# Patient Record
Sex: Female | Born: 1975 | Marital: Married | Smoking: Never smoker
Health system: Southern US, Community
[De-identification: ages and names within clinical notes are randomized; demographics above are authoritative.]

## PROBLEM LIST (undated history)

## (undated) DIAGNOSIS — Z9882 Breast implant status: Secondary | ICD-10-CM

## (undated) DIAGNOSIS — C801 Malignant (primary) neoplasm, unspecified: Secondary | ICD-10-CM

## (undated) DIAGNOSIS — D249 Benign neoplasm of unspecified breast: Secondary | ICD-10-CM

## (undated) DIAGNOSIS — C50919 Malignant neoplasm of unspecified site of unspecified female breast: Secondary | ICD-10-CM

## (undated) DIAGNOSIS — IMO0002 Reserved for concepts with insufficient information to code with codable children: Secondary | ICD-10-CM

## (undated) DIAGNOSIS — Z9889 Other specified postprocedural states: Secondary | ICD-10-CM

## (undated) HISTORY — DX: Other specified postprocedural states: Z98.890

## (undated) HISTORY — DX: Reserved for concepts with insufficient information to code with codable children: IMO0002

## (undated) HISTORY — DX: Malignant neoplasm of unspecified site of unspecified female breast: C50.919

## (undated) HISTORY — DX: Breast implant status: Z98.82

## (undated) HISTORY — DX: Benign neoplasm of unspecified breast: D24.9

## (undated) HISTORY — PX: MASTECTOMY: SHX3

---

## 2001-05-26 DIAGNOSIS — D249 Benign neoplasm of unspecified breast: Secondary | ICD-10-CM

## 2001-05-26 HISTORY — PX: BREAST EXCISIONAL BIOPSY: SUR124

## 2001-05-26 HISTORY — DX: Benign neoplasm of unspecified breast: D24.9

## 2011-09-24 ENCOUNTER — Encounter: Payer: Self-pay | Admitting: Physician Assistant

## 2011-09-24 ENCOUNTER — Ambulatory Visit
Admission: RE | Admit: 2011-09-24 | Discharge: 2011-09-24 | Disposition: A | Discharge status: Home / Self Care | Payer: Managed Care, Other (non HMO) | Source: Ambulatory Visit | Attending: Physician Assistant | Admitting: Physician Assistant

## 2011-09-24 ENCOUNTER — Ambulatory Visit (INDEPENDENT_AMBULATORY_CARE_PROVIDER_SITE_OTHER): Payer: Managed Care, Other (non HMO) | Admitting: Physician Assistant

## 2011-09-24 VITALS — BP 129/74 | HR 72 | Temp 98.4°F | Ht 69.0 in | Wt 165.0 lb

## 2011-09-24 DIAGNOSIS — R0789 Other chest pain: Secondary | ICD-10-CM

## 2011-09-24 DIAGNOSIS — Z7689 Persons encountering health services in other specified circumstances: Secondary | ICD-10-CM

## 2011-09-24 DIAGNOSIS — R071 Chest pain on breathing: Secondary | ICD-10-CM

## 2011-09-24 DIAGNOSIS — Z0189 Encounter for other specified special examinations: Secondary | ICD-10-CM

## 2011-09-24 NOTE — Progress Notes (Signed)
  Subjective:    Patient ID: Katrina Deleon, female    DOB: 1976-01-09, 36 y.o.   MRN: [id number]  HPI Patient presents to the clinic to establish care. Past medical history and health maintenance were reviewed. Patient also wants to discuss right-sided chest wall tenderness and discomfort. Patient did have an upper respiratory infection 3 weeks ago with a sore throat and constant cough. She was treated only with over-the-counter eye is and has felt better for the last week except for her lingering cough. Her cough is better and she does not have any productivity. Her chest discomfort and pain started about a week and half ago. The pain is worse when she coughs. She has had no shortness of breath or wheezing. She denies any arm or jaw pain. She does not have any worsening of pain with exertion. She describes the pain as dull and like a pulled muscle on the inside. She does work out she has not worked out recently due to feeling so bad. She has not tried anything to make the pain better. She does not have a history of allergies but does complain of eye watering and itching this and a constant drainage of her throat. She also denies any symptoms of acid reflux.    Review of Systems     Objective:   Physical Exam  Constitutional: She is oriented to person, place, and time. She appears well-developed and well-nourished.  HENT:  Head: Normocephalic and atraumatic.  Right Ear: External ear normal.  Left Ear: External ear normal.  Nose: Nose normal.       Bilateral TMs were clear and ossicles are well visualized; however, there were multiple air bubbles throughout both TMs. Postnasal drip is present oropharynx.  Eyes: Conjunctivae are normal.  Neck: Normal range of motion. Neck supple.  Cardiovascular: Normal rate, regular rhythm and normal heart sounds.   Pulmonary/Chest: Effort normal and breath sounds normal. She has no wheezes.       No chest wall tenderness to palpation.  Lymphadenopathy:    She  has no cervical adenopathy.  Neurological: She is alert and oriented to person, place, and time.  Skin: Skin is warm and dry.  Psychiatric: She has a normal mood and affect. Her behavior is normal.          Assessment & Plan:  Right sided chest wall pain- I suspect this is costochondritis due to coughing and recent URI. We will get a chest x-ray today to rule out any pulmonary causes. Pregnancy test was conducted in office and was negative due to not being on birth control before the x-ray. I encouraged patient to take Delsym over-the-counter for any lingering cough. I suspect that she might have some postnasal drip to 2 allergies I recommended her to start Zyrtec daily. For the chest wall pain she can take ibuprofen 400 mg up to 3 times a day. I reassured her that costochondritis can last for up to 6 weeks. I gave handout on costochondritis. She was told that if chest tightness got worse or if there any new symptoms to call office and we could work up her heart for any causes.

## 2011-09-24 NOTE — Patient Instructions (Signed)
Delsym over the counter tends to be the best thing cough. I would try Zyrtec daily for post nasal drip and allergic symptoms. Take ibuprofen 400mg  up to three times a day for chest discomfort.  Will get chest x-ray and call with results.   Costochondritis Costochondritis (Tietze syndrome), or costochondral separation, is a swelling and irritation (inflammation) of the tissue (cartilage) that connects your ribs with your breastbone (sternum). It may occur on its own (spontaneously), through damage caused by an accident (trauma), or simply from coughing or minor exercise. It may take up to 6 weeks to get better and longer if you are unable to be conservative in your activities. HOME CARE INSTRUCTIONS   Avoid exhausting physical activity. Try not to strain your ribs during normal activity. This would include any activities using chest, belly (abdominal), and side muscles, especially if heavy weights are used.   Use ice for 15 to 20 minutes per hour while awake for the first 2 days. Place the ice in a plastic bag, and place a towel between the bag of ice and your skin.   Only take over-the-counter or prescription medicines for pain, discomfort, or fever as directed by your caregiver.  SEEK IMMEDIATE MEDICAL CARE IF:   Your pain increases or you are very uncomfortable.   You have a fever.   You develop difficulty with your breathing.   You cough up blood.   You develop worse chest pains, shortness of breath, sweating, or vomiting.   You develop new, unexplained problems (symptoms).  MAKE SURE YOU:   Understand these instructions.   Will watch your condition.   Will get help right away if you are not doing well or get worse.  Document Released: 02/19/2005 Document Revised: 05/01/2011 Document Reviewed: 12/29/2007 [facility] Patient Information 2012 [place], [place].

## 2012-06-09 ENCOUNTER — Encounter: Payer: Self-pay | Admitting: Physician Assistant

## 2012-06-09 ENCOUNTER — Ambulatory Visit (INDEPENDENT_AMBULATORY_CARE_PROVIDER_SITE_OTHER): Payer: Managed Care, Other (non HMO) | Admitting: Physician Assistant

## 2012-06-09 VITALS — BP 119/73 | HR 61 | Wt 165.0 lb

## 2012-06-09 DIAGNOSIS — Z13220 Encounter for screening for lipoid disorders: Secondary | ICD-10-CM

## 2012-06-09 DIAGNOSIS — Z0000 Encounter for general adult medical examination without abnormal findings: Secondary | ICD-10-CM

## 2012-06-09 DIAGNOSIS — Z131 Encounter for screening for diabetes mellitus: Secondary | ICD-10-CM

## 2012-06-09 LAB — COMPREHENSIVE METABOLIC PANEL
ALT: 15 U/L (ref 0–35)
AST: 15 U/L (ref 0–37)
Alkaline Phosphatase: 47 U/L (ref 39–117)
CO2: 26 mEq/L (ref 19–32)
Creat: 0.98 mg/dL (ref 0.50–1.10)
Sodium: 137 mEq/L (ref 135–145)
Total Bilirubin: 1 mg/dL (ref 0.3–1.2)
Total Protein: 7.4 g/dL (ref 6.0–8.3)

## 2012-06-09 LAB — LIPID PANEL
HDL: 59 mg/dL (ref >39)
LDL Cholesterol: 89 mg/dL (ref 0–99)
Total CHOL/HDL Ratio: 2.7 Ratio
Triglycerides: 51 mg/dL (ref <150)
VLDL: 10 mg/dL (ref 0–40)

## 2012-06-09 NOTE — Patient Instructions (Addendum)
Check on Tdap. Can make nurse visit if need one.   Will call with lab results.   Continue calcium add vitamin d 800 IU daily. Continue all the exercising!! GOOD work.   Follow up in 1 year.

## 2012-06-09 NOTE — Progress Notes (Signed)
  Subjective:     Katrina Deleon is a 37 y.o. female and is here for a comprehensive physical exam. The patient reports no problems.  History   Social History  . Marital Status: Married    Spouse Name: N/A    Number of Children: N/A  . Years of Education: N/A   Occupational History  . Not on file.   Social History Main Topics  . Smoking status: Never Smoker   . Smokeless tobacco: Not on file  . Alcohol Use: 0.5 oz/week    1 drink(s) per week  . Drug Use: No  . Sexually Active: Yes -- Female partner(s)    Birth Control/ Protection: None   Other Topics Concern  . Not on file   Social History Narrative  . No narrative on file   Health Maintenance  Topic Date Due  . Tetanus/tdap  05/07/1996  . Pap Smear  06/09/2014  . Influenza Vaccine  01/25/2012    The following portions of the patient's history were reviewed and updated as appropriate: allergies, current medications, past family history, past medical history, past social history, past surgical history and problem list.  Review of Systems A comprehensive review of systems was negative.   Objective:    BP 119/73  Pulse 61  Wt 165 lb (74.844 kg)  LMP 05/30/2012 General appearance: alert, cooperative and appears stated age Head: Normocephalic, without obvious abnormality, atraumatic Eyes: conjunctivae/corneas clear. PERRL, EOM's intact. Fundi benign. Ears: normal TM's and external ear canals both ears Nose: Nares normal. Septum midline. Mucosa normal. No drainage or sinus tenderness. Throat: lips, mucosa, and tongue normal; teeth and gums normal Neck: no adenopathy, no carotid bruit, no JVD, supple, symmetrical, trachea midline and thyroid not enlarged, symmetric, no tenderness/mass/nodules Back: symmetric, no curvature. ROM normal. No CVA tenderness. Lungs: clear to auscultation bilaterally Heart: regular rate and rhythm, S1, S2 normal, no murmur, click, rub or gallop Abdomen: soft, non-tender; bowel sounds  normal; no masses,  no organomegaly Extremities: extremities normal, atraumatic, no cyanosis or edema Pulses: 2+ and symmetric Skin: Skin color, texture, turgor normal. No rashes or lesions Lymph nodes: Cervical, supraclavicular, and axillary nodes normal. Neurologic: Grossly normal    Assessment:    Healthy female exam.      Plan:    CPE- Tdap is needed. Pt wants to go through records and see if she has had. Declines flu shot. Scheduled for pap and mammogram(early due to family hx of breast cancer) at [facility] on January 29th. Ordered fasting labs. Pt already taking calcium told her to continue but to add Vit D 800IU for better absorption. Continue exercising regularly with strength training. Follow up in 1 year.  See After Visit Summary for Counseling Recommendations

## 2012-06-23 ENCOUNTER — Encounter: Payer: Managed Care, Other (non HMO) | Admitting: Obstetrics & Gynecology

## 2012-06-29 ENCOUNTER — Ambulatory Visit (INDEPENDENT_AMBULATORY_CARE_PROVIDER_SITE_OTHER): Payer: Managed Care, Other (non HMO) | Admitting: Obstetrics & Gynecology

## 2012-06-29 ENCOUNTER — Encounter: Payer: Self-pay | Admitting: Obstetrics & Gynecology

## 2012-06-29 VITALS — BP 130/79 | HR 69 | Temp 98.6°F | Resp 16 | Ht 69.0 in | Wt 164.0 lb

## 2012-06-29 DIAGNOSIS — N979 Female infertility, unspecified: Secondary | ICD-10-CM

## 2012-06-29 DIAGNOSIS — Z01419 Encounter for gynecological examination (general) (routine) without abnormal findings: Secondary | ICD-10-CM

## 2012-06-29 DIAGNOSIS — Z803 Family history of malignant neoplasm of breast: Secondary | ICD-10-CM

## 2012-06-29 NOTE — Progress Notes (Signed)
  Subjective:     Katrina Deleon is a 37 y.o. female here for a routine exam.  Current complaints: infertility for > 58yr.  Personal health questionnaire reviewed: yes.   Gynecologic History Patient's last menstrual period was 06/18/2012. Contraception: none Last Pap: 2012. Results were: normal per pt Last mammogram: approx 2011. Results were: normal per pt  Obstetric History OB History    Grav Para Term Preterm Abortions TAB SAB Ect Mult Living   0 0 0 0 0 0 0 0 0 0        The following portions of the patient's history were reviewed and updated as appropriate: allergies, current medications, past family history, past medical history, past social history, past surgical history and problem list.  Review of Systems A comprehensive review of systems was negative.    Objective:   Filed Vitals:   06/29/12 1528  BP: 130/79  Pulse: 69  Temp: 98.6 F (37 C)  TempSrc: Oral  Resp: 16  Height: 5\' 9"  (1.753 m)  Weight: 164 lb (74.39 kg)     Vitals:  WNL General appearance: alert, cooperative and no distress Head: Normocephalic, without obvious abnormality, atraumatic Eyes: negative Throat: lips, mucosa, and tongue normal; teeth and gums normal Lungs: clear to auscultation bilaterally Breasts: normal appearance, no masses or tenderness, No nipple retraction or dimpling, No nipple discharge or bleeding Heart: regular rate and rhythm Abdomen: soft, non-tender; bowel sounds normal; no masses,  no organomegaly Pelvic: cervix normal in appearance, external genitalia normal, no adnexal masses or tenderness, no bladder tenderness, no cervical motion tenderness, perianal skin: no external genital warts noted, urethra without abnormality or discharge, uterus normal size, shape, and consistency and vagina normal without discharge Extremities: no edema, redness or tenderness in the calves or thighs Skin: no lesions or rash Lymph nodes: Axillary adenopathy: none       Assessment:    Healthy female exam.  Infertiltiy Mother--history of breast cancer   Plan:    Education reviewed: skin cancer screening and folic acid supplementation. Contraception: none. Mammogram ordered. Follow up in: 1 month. Discussed BRCA testing (mother does not have insurance) Folic acid supplementation Gave info on HSG and semen analysis Pap in 3 yrs if co testing is nml

## 2012-06-29 NOTE — Patient Instructions (Signed)
Semen Analysis This is a test used to learn about the health of your reproductive organs, particularly if your partner is having trouble becoming pregnant, or after a vasectomy to determine if the operation was successful. Semen is the turbid, whitish substance that is released from the penis during ejaculation. Sperm are the cells in semen with a head and a tail that enables them to travel to the egg. A sperm contains one copy of each chromosome (all of the female's genes) and fuses with the female's egg, resulting in fertilization.  PREPARATION FOR TEST A semen sample will be collected in a sterile, wide-mouth container provided by the lab. NORMAL FINDINGS  Volume: 2-5 mL  Liquefaction time: 20 to 30 minutes after collection  pH: 7.12-8.00  Sperm count (density): 50-200 million/mL  Sperm motility: 60% to 80% actively motile  Sperm morphology: 70% to 90% normally shaped Ranges for normal findings may vary among different laboratories and hospitals. You should always check with your doctor after having lab work or other tests done to discuss the meaning of your test results and whether your values are considered within normal limits. MEANING OF TEST  Your caregiver will go over the test results with you and discuss the importance and meaning of your results, as well as treatment options and the need for additional tests if necessary. OBTAINING THE TEST RESULTS It is your responsibility to obtain your test results. Ask the lab or department performing the test when and how you will get your results. Document Released: 06/06/2004 Document Revised: 08/04/2011 Document Reviewed: 04/23/2008 [facility] Patient Information 2013 [place], [place]. Hysterosalpingography Hysterosalpingography is a procedure using an X-ray and contrast dye to look at the inside of your uterus and fallopian tubes. The contrast dye is injected into the uterus through the vagina and cervix while X-ray pictures are taken. This  procedure may help your caregiver determine whether you have uterine tumors, adhesions, or structural abnormalities. It is commonly used to help determine reasons why a woman is unable to have children (infertile). LET YOUR CAREGIVER KNOW ABOUT:  Allergies to medicine or food, especially shellfish.  Medicines taken, including vitamins, herbs, eyedrops, over-the-counter medicines, and creams.  Use of steroids (by mouth or creams).  Previous problems with anesthetics or numbing medicines.  History of bleeding problems or blood clots.  Previous pelvic surgery.  Other health problems, including diabetes and kidney problems.  Possibility of pregnancy.  Any allergies to iodine or X-ray dye (contrast dye).  Any recent pelvic infections or sexually transmitted diseases (STDs). RISKS AND COMPLICATIONS   Infection in the lining of the uterus (endometritis) or fallopian tubes (salpingitis).  Damage or puncturing of the uterus or fallopian tubes.  An allergic reaction to the contrast dye used to perform the X-ray. BEFORE THE PROCEDURE   Schedule the procedure after your period stops, but before your next ovulation. This is usually between day 5 and 10 of your last period. Day 1 is the first day of your period.  Ask your caregiver about changing or stopping your regular medicines.  You may eat and drink as normal.  You may be given a medicine to relax you (sedative) or an over-the-counter pain medicine to lessen any discomfort during the procedure.  Empty your bladder before the procedure begins. PROCEDURE  You will lie down on an X-ray table with your feet in stirrups.  A device called a speculum will be placed into your vagina. This allows your caregiver to see inside your vagina to the cervix.  The cervix will be washed with a special soap.  A thin, flexible tube will be passed through the cervix into the uterus.  Contrast dye will be put into this tube.  Several X-rays will  be taken as the contrast dye spreads through the uterus and fallopian tubes.  The tube will be taken out after the procedure.  The procedure usually lasts about 15 to 30 minutes. AFTER THE PROCEDURE   Most of the contrast dye will flow out naturally. You may want to wear a sanitary pad.  You may have cramping and spotting. This should go away in 24 hours.  Ask when your test results will be ready. Make sure you get your test results. Document Released: 06/14/2004 Document Revised: 08/04/2011 Document Reviewed: 04/01/2011 [facility] Patient Information 2013 [place], [place].

## 2012-09-21 ENCOUNTER — Ambulatory Visit (INDEPENDENT_AMBULATORY_CARE_PROVIDER_SITE_OTHER): Payer: Managed Care, Other (non HMO) | Admitting: Family Medicine

## 2012-09-21 ENCOUNTER — Encounter: Payer: Self-pay | Admitting: Family Medicine

## 2012-09-21 VITALS — BP 111/67 | HR 84 | Temp 98.7°F | Wt 169.0 lb

## 2012-09-21 DIAGNOSIS — B9689 Other specified bacterial agents as the cause of diseases classified elsewhere: Secondary | ICD-10-CM

## 2012-09-21 DIAGNOSIS — J329 Chronic sinusitis, unspecified: Secondary | ICD-10-CM

## 2012-09-21 DIAGNOSIS — A499 Bacterial infection, unspecified: Secondary | ICD-10-CM

## 2012-09-21 MED ORDER — HYDROCODONE-HOMATROPINE 5-1.5 MG/5ML PO SYRP: 5.0000 mL | ORAL_SOLUTION | Freq: Every evening | ORAL | Status: DC | PRN

## 2012-09-21 MED ORDER — PREDNISONE 20 MG PO TABS: ORAL_TABLET | ORAL | Status: DC

## 2012-09-21 MED ORDER — DOXYCYCLINE HYCLATE 100 MG PO TABS: ORAL_TABLET | ORAL | Status: DC

## 2012-09-21 NOTE — Progress Notes (Signed)
CC: Katrina Deleon is a 37 y.o. female is here for Sinusitis   Subjective: HPI:  Patient presents for concerns of nasal congestion. This is been present off and on ever since November of 2013 after cleaning out her attic.  Current symptoms include moderate nasal congestion of thick yellow discharge with a heaviness sensation within the nose. This discomfort is directly related to a nonproductive cough and irritation in the back of her throat. Symptoms seem to be worse when leaning forward. Current episode of symptoms have been lasting for about 2 weeks and remain a moderate severity all hours of the day.  Identical symptoms were eradicated with high-dose amoxicillin months ago but symptoms returned within 2 weeks. This was followed by Augmentin that did not help despite 10 days therapy. She has also tried an unknown nasal steroid without benefit.  Recently she complains of subjective fevers and a MAXIMUM TEMPERATURE of 100.0 oral. She denies confusion, headaches, motor sensory disturbances, shortness of breath, wheezing, chest pain, productive cough, orthopnea.   Review Of Systems Outlined In HPI  Past Medical History  Diagnosis Date  . Fibroadenoma of breast 2003  . Ulcer      Family History  Problem Relation Age of Onset  . Breast cancer Mother   . Breast cancer Paternal Grandmother   . Uterine cancer Maternal Grandmother      History  Substance Use Topics  . Smoking status: Never Smoker   . Smokeless tobacco: Never Used  . Alcohol Use: 0.5 oz/week    1 drink(s) per week     Objective: Filed Vitals:   09/21/12 0929  BP: 111/67  Pulse: 84  Temp: 98.7 F (37.1 C)    General: Alert and Oriented, No Acute Distress HEENT: Pupils equal, round, reactive to light. Conjunctivae clear.  External ears unremarkable, canals clear with intact TMs with appropriate landmarks.  Serous effusion of moderate severity bilaterally in the middle ear. Erythematous inferior turbinates with  moderate mucoid discharge.  Moist mucous membranes, Neck supple without palpable lymphadenopathy nor abnormal masses. Moderate posterior pharynx cobblestoning Lungs: Clear to auscultation bilaterally, no wheezing/ronchi/rales.  Comfortable work of breathing. Good air movement. Cardiac: Regular rate and rhythm. Normal S1/S2.  No murmurs, rubs, nor gallops.   Extremities: No peripheral edema.  Strong peripheral pulses.  Mental Status: No depression, anxiety, nor agitation. Skin: Warm and dry.  Assessment & Plan: [name] was seen today for sinusitis.  Diagnoses and associated orders for this visit:  Bacterial sinusitis - doxycycline (VIBRA-TABS) 100 MG tablet; One by mouth twice a day for ten days. - predniSONE (DELTASONE) 20 MG tablet; Three tabs daily days 1-3, two tabs daily days 4-6, one tab daily days 7-9, half tab daily days 10-13. - HYDROcodone-homatropine (HYCODAN) 5-1.5 MG/5ML syrup; Take 5 mLs by mouth at bedtime as needed for cough.    Bacterial sinusitis: Start prednisone taper and doxycycline and may use Hycodan to help with sleep but discussed I expect her cough is significantly improved as sinus and postnasal drip issues resolve. I've asked her to call me for lack of improvement or return of symptoms to then have CT scan of the sinuses Return if symptoms worsen or fail to improve.

## 2012-09-27 ENCOUNTER — Encounter: Payer: Self-pay | Admitting: Family Medicine

## 2012-09-27 ENCOUNTER — Ambulatory Visit (INDEPENDENT_AMBULATORY_CARE_PROVIDER_SITE_OTHER): Payer: Managed Care, Other (non HMO) | Admitting: Family Medicine

## 2012-09-27 VITALS — BP 113/74 | HR 64 | Temp 97.9°F | Wt 165.0 lb

## 2012-09-27 DIAGNOSIS — J309 Allergic rhinitis, unspecified: Secondary | ICD-10-CM

## 2012-09-27 MED ORDER — BECLOMETHASONE DIPROPIONATE 80 MCG/ACT NA AERS: 2.0000 | INHALATION_SPRAY | Freq: Every day | NASAL | Status: DC

## 2012-09-27 NOTE — Progress Notes (Signed)
CC: Katrina Deleon is a 37 y.o. female is here for Sinusitis   Subjective: HPI:  Complains of nasal congestion worsened since stopping prednisone Friday (nausea, fatigue, leg pain, trouble concentrating) and doxycycline Saturday (nausea).  Clear and without facial pain.  Moderate-severe congestion sensation in nose with inability to breath through nose.  Improves greatly with overseas Rx that sounds to be similar to afrin, has taken nightly since Saturday.  Congestion worse than when seen last week, has been at least mild-moderate severity since November attic cleaning.  Admits last nasal steroid only used for a few days in the past.  Denies fevers, chills, nausea, headache, confusion, chest congestion, chest pain, SOB since stopping prednisone and ABX.      Review Of Systems Outlined In HPI  Past Medical History  Diagnosis Date  . Fibroadenoma of breast 2003  . Ulcer      Family History  Problem Relation Age of Onset  . Breast cancer Mother   . Breast cancer Paternal Grandmother   . Uterine cancer Maternal Grandmother      History  Substance Use Topics  . Smoking status: Never Smoker   . Smokeless tobacco: Never Used  . Alcohol Use: 0.5 oz/week    1 drink(s) per week     Objective: Filed Vitals:   09/27/12 1136  BP: 113/74  Pulse: 64  Temp: 97.9 F (36.6 C)    General: Alert and Oriented, No Acute Distress HEENT: Pupils equal, round, reactive to light. Conjunctivae clear.  Moist mucous membranes Lungs: clear and comfortable work of breathing Extremities: No peripheral edema.  Strong peripheral pulses.  Mental Status: No depression, anxiety, nor agitation. Skin: Warm and dry.  Assessment & Plan: Sapphire was seen today for sinusitis.  Diagnoses and associated orders for this visit:  Allergic rhinitis - Beclomethasone Dipropionate 80 MCG/ACT AERS; Place 2 sprays into the nose daily.    Patient encounter cut short due to evacuation of clinc due to phenol  spill.  Will not charge her due to incomplete exam and due to inconvienence.  Start Qnasl and update me later in the week, may take up to 2 weeks for full effect, expect some improvement by Friday-Monday  Return if symptoms worsen or fail to improve.

## 2014-07-05 ENCOUNTER — Emergency Department (HOSPITAL_BASED_OUTPATIENT_CLINIC_OR_DEPARTMENT_OTHER)
Admission: EM | Admit: 2014-07-05 | Discharge: 2014-07-06 | Disposition: A | Discharge status: Home / Self Care | Payer: Managed Care, Other (non HMO) | Attending: Emergency Medicine | Admitting: Emergency Medicine

## 2014-07-05 ENCOUNTER — Emergency Department (HOSPITAL_BASED_OUTPATIENT_CLINIC_OR_DEPARTMENT_OTHER): Payer: Managed Care, Other (non HMO)

## 2014-07-05 ENCOUNTER — Encounter (HOSPITAL_BASED_OUTPATIENT_CLINIC_OR_DEPARTMENT_OTHER): Payer: Self-pay

## 2014-07-05 ENCOUNTER — Ambulatory Visit (INDEPENDENT_AMBULATORY_CARE_PROVIDER_SITE_OTHER): Payer: Managed Care, Other (non HMO) | Admitting: Family Medicine

## 2014-07-05 ENCOUNTER — Encounter: Payer: Self-pay | Admitting: Family Medicine

## 2014-07-05 DIAGNOSIS — R197 Diarrhea, unspecified: Secondary | ICD-10-CM | POA: Insufficient documentation

## 2014-07-05 DIAGNOSIS — R110 Nausea: Secondary | ICD-10-CM | POA: Diagnosis not present

## 2014-07-05 DIAGNOSIS — R1031 Right lower quadrant pain: Secondary | ICD-10-CM | POA: Insufficient documentation

## 2014-07-05 DIAGNOSIS — Z7951 Long term (current) use of inhaled steroids: Secondary | ICD-10-CM | POA: Diagnosis not present

## 2014-07-05 DIAGNOSIS — R1032 Left lower quadrant pain: Secondary | ICD-10-CM | POA: Diagnosis not present

## 2014-07-05 DIAGNOSIS — Z86018 Personal history of other benign neoplasm: Secondary | ICD-10-CM | POA: Insufficient documentation

## 2014-07-05 DIAGNOSIS — R1030 Lower abdominal pain, unspecified: Secondary | ICD-10-CM

## 2014-07-05 DIAGNOSIS — Z3202 Encounter for pregnancy test, result negative: Secondary | ICD-10-CM | POA: Insufficient documentation

## 2014-07-05 DIAGNOSIS — Z79899 Other long term (current) drug therapy: Secondary | ICD-10-CM | POA: Insufficient documentation

## 2014-07-05 DIAGNOSIS — R109 Unspecified abdominal pain: Secondary | ICD-10-CM

## 2014-07-05 HISTORY — DX: Malignant (primary) neoplasm, unspecified: C80.1

## 2014-07-05 LAB — COMPREHENSIVE METABOLIC PANEL
ALBUMIN: 4.5 g/dL (ref 3.5–5.2)
ALK PHOS: 41 U/L (ref 39–117)
ALT: 15 U/L (ref 0–35)
ANION GAP: 4 — AB (ref 5–15)
AST: 17 U/L (ref 0–37)
BILIRUBIN TOTAL: 1 mg/dL (ref 0.3–1.2)
BUN: 17 mg/dL (ref 6–23)
CHLORIDE: 105 mmol/L (ref 96–112)
CO2: 27 mmol/L (ref 19–32)
CREATININE: 0.93 mg/dL (ref 0.50–1.10)
Calcium: 8.8 mg/dL (ref 8.4–10.5)
GFR, EST AFRICAN AMERICAN: 90 mL/min — AB (ref >90)
GFR, EST NON AFRICAN AMERICAN: 78 mL/min — AB (ref >90)
GLUCOSE: 103 mg/dL — AB (ref 70–99)
POTASSIUM: 3.9 mmol/L (ref 3.5–5.1)
Sodium: 136 mmol/L (ref 135–145)
Total Protein: 8.2 g/dL (ref 6.0–8.3)

## 2014-07-05 LAB — URINALYSIS, ROUTINE W REFLEX MICROSCOPIC
Bilirubin Urine: NEGATIVE
Glucose, UA: NEGATIVE mg/dL
Hgb urine dipstick: NEGATIVE
Ketones, ur: 15 mg/dL — AB
Nitrite: NEGATIVE
PROTEIN: NEGATIVE mg/dL
Specific Gravity, Urine: 1.02 (ref 1.005–1.030)
Urobilinogen, UA: 0.2 mg/dL (ref 0.0–1.0)
pH: 5.5 (ref 5.0–8.0)

## 2014-07-05 LAB — POCT URINE PREGNANCY: Preg Test, Ur: NEGATIVE

## 2014-07-05 LAB — POCT URINALYSIS DIPSTICK
BILIRUBIN UA: NEGATIVE
Blood, UA: NEGATIVE
Glucose, UA: NEGATIVE
Ketones, UA: NEGATIVE
NITRITE UA: NEGATIVE
PH UA: 6
PROTEIN UA: NEGATIVE
Spec Grav, UA: 1.02
UROBILINOGEN UA: 0.2

## 2014-07-05 LAB — CBC
HEMATOCRIT: 41.7 % (ref 36.0–46.0)
Hemoglobin: 14 g/dL (ref 12.0–15.0)
MCH: 30.4 pg (ref 26.0–34.0)
MCHC: 33.6 g/dL (ref 30.0–36.0)
MCV: 90.5 fL (ref 78.0–100.0)
Platelets: 240 10*3/uL (ref 150–400)
RBC: 4.61 MIL/uL (ref 3.87–5.11)
RDW: 13.2 % (ref 11.5–15.5)
WBC: 16 10*3/uL — AB (ref 4.0–10.5)

## 2014-07-05 LAB — URINE MICROSCOPIC-ADD ON

## 2014-07-05 LAB — PREGNANCY, URINE: Preg Test, Ur: NEGATIVE

## 2014-07-05 MED ORDER — IOHEXOL 300 MG/ML  SOLN
100.0000 mL | Freq: Once | INTRAMUSCULAR | Status: AC | PRN
  Administered 2014-07-05: 100 mL via INTRAVENOUS

## 2014-07-05 MED ORDER — IOHEXOL 300 MG/ML  SOLN
25.0000 mL | Freq: Once | INTRAMUSCULAR | Status: AC | PRN
  Administered 2014-07-05: 25 mL via ORAL

## 2014-07-05 MED ORDER — ACETAMINOPHEN 325 MG PO TABS
650.0000 mg | ORAL_TABLET | Freq: Once | ORAL | Status: AC
  Administered 2014-07-05: 650 mg via ORAL
  Filled 2014-07-05: qty 2

## 2014-07-05 MED ORDER — SODIUM CHLORIDE 0.9 % IV BOLUS (SEPSIS)
1000.0000 mL | Freq: Once | INTRAVENOUS | Status: AC
Start: 2014-07-05 — End: 2014-07-05
  Administered 2014-07-05: 1000 mL via INTRAVENOUS

## 2014-07-05 MED ORDER — IOHEXOL 300 MG/ML  SOLN: 100.0000 mL | Freq: Once | INTRAMUSCULAR | Status: AC | PRN

## 2014-07-05 NOTE — Progress Notes (Signed)
CC: Katrina Deleon is a 39 y.o. female is here for Abdominal Pain   Subjective: HPI:  Complains of pain and bloating localized at the umbilicus and just below the umbilicus. It is constant and also throbbing. Nothing particularly makes it better or worse. It has been worsening since onset and last night was bothering her slightly before going to bed however became bad enough to where it woke her and kept her up for the majority of last night. She had 2-3 bowel movements last night after the pain escalated and this did not seem to change the character or severity of pain but was more loose that she is used to. There is no blood or melena appearance to the stool. She's had a decreased appetite and questionable nausea. Pain is radiating into the back as of today. She has no genitourinary complaints today other than typical vaginal secretions at this time of her cycle that may or may not be heavier than usual. She specifically denies any vaginal bleeding, dysuria, urinary urgency or frequency. Review of systems is positive for feeling cold and clammy today. No fevers or chills. She's never had this before and has no history of abdominal or pelvic surgery.   Review Of Systems Outlined In HPI  Past Medical History  Diagnosis Date  . Fibroadenoma of breast 2003  . Ulcer     Past Surgical History  Procedure Laterality Date  . Breast excisional biopsy  2003    benign   Family History  Problem Relation Age of Onset  . Breast cancer Mother   . Breast cancer Paternal Grandmother   . Uterine cancer Maternal Grandmother     History   Social History  . Marital Status: Married    Spouse Name: N/A  . Number of Children: N/A  . Years of Education: N/A   Occupational History  . accountant    Social History Main Topics  . Smoking status: Never Smoker   . Smokeless tobacco: Never Used  . Alcohol Use: 0.5 oz/week    1 drink(s) per week  . Drug Use: No  . Sexual Activity:    Partners: Male     Birth Control/ Protection: None   Other Topics Concern  . Not on file   Social History Narrative     Objective: BP 106/63 mmHg  Pulse 75  Ht 5\' 11"  (1.803 m)  Wt 147 lb (66.679 kg)  BMI 20.51 kg/m2  General: Alert and Oriented, No Acute Distress HEENT: Pupils equal, round, reactive to light. Conjunctivae clear.  Moist mucous membranes Lungs: clinical comfortable work of breathing Cardiac: Regular rate and rhythm.  Abdomen: Normal bowel sounds, soft without guarding. No right upper quadrant or left upper quadrant pain or palpable masses in these quadrants. No palpable masses elsewhere. She has both right and left lower quadrant tenderness and rebound tenderness. Psoas sign positive on the right. Extremities: No peripheral edema.  Strong peripheral pulses.  Mental Status: No depression, anxiety, nor agitation. Skin: Warm and dry.  Assessment & Plan: Katrina Deleon was seen today for abdominal pain.  Diagnoses and all orders for this visit:  Abdominal pain, unspecified abdominal location Orders: -     POCT Urinalysis Dipstick   Abdominal pain: Urinalysis significant only for leukocytes, low suspicion of UTI. Pregnancy test was negative. Discussed with her that my suspicion for appendicitis or diverticulitis is quite high. Unfortunately outpatient imaging offices will not be an option tonight given that it is 5:00 PM. I suspicion is high  enough to where I think she needs imaging tonight I have advised her to go to a local emergency room of her choice. She was originally going to go to the [facility] emergency room however when  This emergency room was called about her arrival they informed my assistant that they're imaging services are off-line. The patient was then advised to go to the [facility] in [facility] and my assistant called the emergency room even though her chief complaint is abdominal pain I'm concerned of appendicitis or diverticulitis.  Return if symptoms  worsen or fail to improve.

## 2014-07-05 NOTE — ED Provider Notes (Signed)
CSN: [id number]     Arrival date & time 07/05/14  1719 History   First MD Initiated Contact with Patient 07/05/14 1745     Chief Complaint  Patient presents with  . Abdominal Pain     (Consider location/radiation/quality/duration/timing/severity/associated sxs/prior Treatment) HPI 39 year old female presents with abdominal pain that started 3 days ago. His been gradually worsening. Last night her pain seems significantly worse and has been concerning her all day. The pain mostly is periumbilical and in her lower abdomen. Some nausea but no vomiting. Has not had any fevers or chills. Feels like her lower back hurts as well. Has started to have progressively looser stools throughout the day. Has not taken anything for the pain. Rates her pain as a 5 or 6 out of 10. Denies any urinary symptoms and has no vaginal bleeding or discharge. Never had pain like this before. Went to her PCP and they described her having rebound tenderness and center here for CT.  Past Medical History  Diagnosis Date  . Fibroadenoma of breast 2003  . Ulcer    Past Surgical History  Procedure Laterality Date  . Breast excisional biopsy  2003    benign   Family History  Problem Relation Age of Onset  . Breast cancer Mother   . Breast cancer Paternal Grandmother   . Uterine cancer Maternal Grandmother    History  Substance Use Topics  . Smoking status: Never Smoker   . Smokeless tobacco: Never Used  . Alcohol Use: 0.5 oz/week    1 drink(s) per week   OB History    Gravida Para Term Preterm AB TAB SAB Ectopic Multiple Living   0 0 0 0 0 0 0 0 0 0      Review of Systems  Constitutional: Negative for fever.  Gastrointestinal: Positive for nausea, abdominal pain and diarrhea. Negative for vomiting.  Genitourinary: Negative for dysuria, vaginal bleeding and vaginal discharge.  Musculoskeletal: Positive for back pain.  All other systems reviewed and are negative.     Allergies  Peanuts  Home  Medications   Prior to Admission medications   Medication Sig Start Date End Date Taking? Authorizing Provider  Beclomethasone Dipropionate 80 MCG/ACT AERS Place 2 sprays into the nose daily. 09/27/12   Marcial Pacas, DO  Calcium Carbonate (CALCIUM 600 PO) Take by mouth.    Historical Provider, MD  fish oil-omega-3 fatty acids 1000 MG capsule Take 2 g by mouth daily.    Historical Provider, MD   BP 136/71 mmHg  Pulse 74  Temp(Src) 98.4 F (36.9 C) (Oral)  Resp 18  Ht 5\' 11"  (1.803 m)  Wt 143 lb (64.864 kg)  BMI 19.95 kg/m2  SpO2 100%  LMP 06/05/2014 Physical Exam  Constitutional: She is oriented to person, place, and time. She appears well-developed and well-nourished.  HENT:  Head: Normocephalic and atraumatic.  Right Ear: External ear normal.  Left Ear: External ear normal.  Nose: Nose normal.  Eyes: Right eye exhibits no discharge. Left eye exhibits no discharge.  Cardiovascular: Normal rate, regular rhythm and normal heart sounds.   Pulmonary/Chest: Effort normal and breath sounds normal.  Abdominal: Soft. Normal appearance. She exhibits no distension. There is tenderness in the right lower quadrant and left lower quadrant. There is no CVA tenderness.  Neurological: She is alert and oriented to person, place, and time.  Skin: Skin is warm and dry.  Nursing note and vitals reviewed.   ED Course  Procedures (including critical care time) Labs Review  Labs Reviewed  CBC - Abnormal; Notable for the following:    WBC 16.0 (*)    All other components within normal limits  COMPREHENSIVE METABOLIC PANEL - Abnormal; Notable for the following:    Glucose, Bld 103 (*)    GFR calc non Af Amer 78 (*)    GFR calc Af Amer 90 (*)    Anion gap 4 (*)    All other components within normal limits  URINALYSIS, ROUTINE W REFLEX MICROSCOPIC - Abnormal; Notable for the following:    Ketones, ur 15 (*)    Leukocytes, UA SMALL (*)    All other components within normal limits  URINE  MICROSCOPIC-ADD ON - Abnormal; Notable for the following:    Squamous Epithelial / LPF FEW (*)    All other components within normal limits  PREGNANCY, URINE    Imaging Review US Transvaginal Non-ob  07/06/2014   CLINICAL DATA:  Right pelvic pain for 4 days, increasing in intensity. CT earlier today demonstrated inflammatory process in the right lower quadrant of nonspecific etiology.  EXAM: TRANSABDOMINAL AND TRANSVAGINAL ULTRASOUND OF PELVIS  DOPPLER ULTRASOUND OF OVARIES  TECHNIQUE: Both transabdominal and transvaginal ultrasound examinations of the pelvis were performed. Transabdominal technique was performed for global imaging of the pelvis including uterus, ovaries, adnexal regions, and pelvic cul-de-sac.  It was necessary to proceed with endovaginal exam following the transabdominal exam to visualize the uterus and ovaries. Color and duplex Doppler ultrasound was utilized to evaluate blood flow to the ovaries.  COMPARISON:  CT 07/05/2014  FINDINGS: Uterus  Measurements: 8.2 x 4.2 x 5.2 cm. No fibroids or other mass visualized.  Endometrium  Thickness: 7 mm.  No focal abnormality visualized.  Right ovary  Measurements: 3.8 x 2 x 2.6 cm. Normal appearance/no adnexal mass. There is fluid around the right ovary but no loculated fluid collection is demonstrated. No evidence of hydrosalpinx.  Left ovary  Measurements: 3 x 1.7 x 2.3 cm. Normal appearance/no adnexal mass.  Pulsed Doppler evaluation of both ovaries demonstrates normal low-resistance arterial and venous waveforms. Flow is demonstrated in both ovaries on color flow Doppler imaging.  Other findings  Moderate amount of free fluid in the pelvis.  IMPRESSION: Normal ultrasound appearance of the uterus and ovaries. Moderate amount of free fluid in the pelvis is likely reactive, given inflammatory process seen in the right lower quadrant on CT. No evidence of ovarian mass, abscess, or torsion.  These results were called by telephone at the time of  interpretation on 07/06/2014 at 12:02 am to Dr. Sherwood Gambler , who verbally acknowledged these results.   Electronically Signed   By: Lucienne Capers M.D.   On: 07/06/2014 00:03   US Pelvis Complete  07/06/2014   CLINICAL DATA:  Right pelvic pain for 4 days, increasing in intensity. CT earlier today demonstrated inflammatory process in the right lower quadrant of nonspecific etiology.  EXAM: TRANSABDOMINAL AND TRANSVAGINAL ULTRASOUND OF PELVIS  DOPPLER ULTRASOUND OF OVARIES  TECHNIQUE: Both transabdominal and transvaginal ultrasound examinations of the pelvis were performed. Transabdominal technique was performed for global imaging of the pelvis including uterus, ovaries, adnexal regions, and pelvic cul-de-sac.  It was necessary to proceed with endovaginal exam following the transabdominal exam to visualize the uterus and ovaries. Color and duplex Doppler ultrasound was utilized to evaluate blood flow to the ovaries.  COMPARISON:  CT 07/05/2014  FINDINGS: Uterus  Measurements: 8.2 x 4.2 x 5.2 cm. No fibroids or other mass visualized.  Endometrium  Thickness: 7  mm.  No focal abnormality visualized.  Right ovary  Measurements: 3.8 x 2 x 2.6 cm. Normal appearance/no adnexal mass. There is fluid around the right ovary but no loculated fluid collection is demonstrated. No evidence of hydrosalpinx.  Left ovary  Measurements: 3 x 1.7 x 2.3 cm. Normal appearance/no adnexal mass.  Pulsed Doppler evaluation of both ovaries demonstrates normal low-resistance arterial and venous waveforms. Flow is demonstrated in both ovaries on color flow Doppler imaging.  Other findings  Moderate amount of free fluid in the pelvis.  IMPRESSION: Normal ultrasound appearance of the uterus and ovaries. Moderate amount of free fluid in the pelvis is likely reactive, given inflammatory process seen in the right lower quadrant on CT. No evidence of ovarian mass, abscess, or torsion.  These results were called by telephone at the time of  interpretation on 07/06/2014 at 12:02 am to Dr. Sherwood Gambler , who verbally acknowledged these results.   Electronically Signed   By: Lucienne Capers M.D.   On: 07/06/2014 00:03   Ct Abdomen Pelvis W Contrast  07/05/2014   CLINICAL DATA:  Periumbilical abdominal pain and bloating for 4 days.  EXAM: CT ABDOMEN AND PELVIS WITH CONTRAST  TECHNIQUE: Multidetector CT imaging of the abdomen and pelvis was performed using the standard protocol following bolus administration of intravenous contrast.  CONTRAST:  159mL OMNIPAQUE IOHEXOL 300 MG/ML SOLN, 65mL OMNIPAQUE IOHEXOL 300 MG/ML SOLN  COMPARISON:  None.  FINDINGS: Lower chest: The lung bases are clear of acute process. No pleural effusion or pulmonary lesions. The heart is normal in size. No pericardial effusion. The distal esophagus and aorta are unremarkable.  Hepatobiliary: Normal.  Pancreas: Normal.  Spleen: Normal.  Adrenals/Urinary Tract: Normal.  Stomach/Bowel: The stomach, duodenum, small bowel and colon are unremarkable. No inflammatory changes, mass lesions or obstructive findings. There is however a right lower quadrant/ right-sided pelvic process which is difficult to completely assess as the patient has very little intraperitoneal fat. Could not exclude appendicitis. This could also be a right ovarian process. There is right lower quadrant and free pelvic fluid.  Vascular/Lymphatic: No mesenteric or retroperitoneal mass or adenopathy. The aorta and branch vessels are normal. The major venous structures are patent.  Reproductive: The uterus appears normal. The left ovary is normal. The right ovary is difficult to distinguish for certain. The bladder is unremarkable.  Other: No abdominal wall hernia or subcutaneous lesions  Musculoskeletal: Normal  IMPRESSION: Right lower quadrant/ right-sided pelvic inflammatory process. Findings could be due to appendicitis or a right ovarian process such as tubo-ovarian abscess. Pelvic ultrasound may be helpful,  particularly if the right ovary can be identified for certain as separate from this process.  Small amount of right-sided pelvic and free pelvic fluid.   Electronically Signed   By: Marijo Sanes M.D.   On: 07/05/2014 21:05   [place] Art/ven Flow Abd Pelv Doppler  07/06/2014   CLINICAL DATA:  Right pelvic pain for 4 days, increasing in intensity. CT earlier today demonstrated inflammatory process in the right lower quadrant of nonspecific etiology.  EXAM: TRANSABDOMINAL AND TRANSVAGINAL ULTRASOUND OF PELVIS  DOPPLER ULTRASOUND OF OVARIES  TECHNIQUE: Both transabdominal and transvaginal ultrasound examinations of the pelvis were performed. Transabdominal technique was performed for global imaging of the pelvis including uterus, ovaries, adnexal regions, and pelvic cul-de-sac.  It was necessary to proceed with endovaginal exam following the transabdominal exam to visualize the uterus and ovaries. Color and duplex Doppler ultrasound was utilized to evaluate blood flow to the  ovaries.  COMPARISON:  CT 07/05/2014  FINDINGS: Uterus  Measurements: 8.2 x 4.2 x 5.2 cm. No fibroids or other mass visualized.  Endometrium  Thickness: 7 mm.  No focal abnormality visualized.  Right ovary  Measurements: 3.8 x 2 x 2.6 cm. Normal appearance/no adnexal mass. There is fluid around the right ovary but no loculated fluid collection is demonstrated. No evidence of hydrosalpinx.  Left ovary  Measurements: 3 x 1.7 x 2.3 cm. Normal appearance/no adnexal mass.  Pulsed Doppler evaluation of both ovaries demonstrates normal low-resistance arterial and venous waveforms. Flow is demonstrated in both ovaries on color flow Doppler imaging.  Other findings  Moderate amount of free fluid in the pelvis.  IMPRESSION: Normal ultrasound appearance of the uterus and ovaries. Moderate amount of free fluid in the pelvis is likely reactive, given inflammatory process seen in the right lower quadrant on CT. No evidence of ovarian mass, abscess, or torsion.   These results were called by telephone at the time of interpretation on 07/06/2014 at 12:02 am to Dr. Sherwood Gambler , who verbally acknowledged these results.   Electronically Signed   By: Lucienne Capers M.D.   On: 07/06/2014 00:03     EKG Interpretation None      MDM   Final diagnoses:  Lower abdominal pain    Given her lower abdominal pain, elevated WBC to 16 and fluid collection in RLQ and pelvis, concern is high for appendicitis. Ultrasound shows no ovarian pathology. Given this, I am concerned for her appendix. Her history and exam is atypical but still concerning. D/w surgery, Dr. Marcello Moores, who recommends medicine observation admission and surgery can be consulted in AM vs follow up in their clinic tomorrow (patient must call). I had an extensive conversation with patient about risks/benefits of staying vs leaving, recommending admission but patient declines, citing her feeling better and cost of admission. I discussed strict return precautions and welcomed her back to ER (here, [facility], [facility], etc) or surgery clinic. Patient and significant other verbalized understanding.    Ephraim Hamburger, MD 07/06/14 0040

## 2014-07-05 NOTE — Addendum Note (Signed)
Addended by: Terance Hart on: 07/05/2014 05:25 PM   Modules accepted: Orders

## 2014-07-05 NOTE — ED Notes (Signed)
Patient transported to Ultrasound 

## 2014-07-05 NOTE — ED Notes (Signed)
Reports lower abdominal pain since Sunday. Sts some nausea but no vomiting. Reports throbbing sensation. Sent by PMD today.

## 2014-07-06 ENCOUNTER — Telehealth: Payer: Self-pay | Admitting: Emergency Medicine

## 2014-07-06 NOTE — Telephone Encounter (Signed)
Patient calls to report on her ER visit yesterday. Her question for Dr.Hommel is: does he think she should go on preventative oral antibiotics since ER physician thought it might be appropriate for unspecified lower right abdominal inflammation/prevetative for possible appendicitis? ER entries available. Please call tomorrow/ 07-07-14.

## 2014-07-06 NOTE — Discharge Instructions (Signed)
I am concerned your abdominal pain is from appendicitis. We have talked about risks and benefits of going home versus admission. If your symptoms recur or worsen in any way I strongly recommended he come back to this ER or [facility] for immediate evaluation. Otherwise I recommend he follow-up with the surgeons tomorrow. Call them for an appointment.    Abdominal Pain, Women Abdominal (stomach, pelvic, or belly) pain can be caused by many things. It is important to tell your doctor:  The location of the pain.  Does it come and go or is it present all the time?  Are there things that start the pain (eating certain foods, exercise)?  Are there other symptoms associated with the pain (fever, nausea, vomiting, diarrhea)? All of this is helpful to know when trying to find the cause of the pain. CAUSES   Stomach: virus or bacteria infection, or ulcer.  Intestine: appendicitis (inflamed appendix), regional ileitis (Crohn's disease), ulcerative colitis (inflamed colon), irritable bowel syndrome, diverticulitis (inflamed diverticulum of the colon), or cancer of the stomach or intestine.  Gallbladder disease or stones in the gallbladder.  Kidney disease, kidney stones, or infection.  Pancreas infection or cancer.  Fibromyalgia (pain disorder).  Diseases of the female organs:  Uterus: fibroid (non-cancerous) tumors or infection.  Fallopian tubes: infection or tubal pregnancy.  Ovary: cysts or tumors.  Pelvic adhesions (scar tissue).  Endometriosis (uterus lining tissue growing in the pelvis and on the pelvic organs).  Pelvic congestion syndrome (female organs filling up with blood just before the menstrual period).  Pain with the menstrual period.  Pain with ovulation (producing an egg).  Pain with an IUD (intrauterine device, birth control) in the uterus.  Cancer of the female organs.  Functional pain (pain not caused by a disease, may improve without  treatment).  Psychological pain.  Depression. DIAGNOSIS  Your doctor will decide the seriousness of your pain by doing an examination.  Blood tests.  X-rays.  Ultrasound.  CT scan (computed tomography, special type of X-ray).  MRI (magnetic resonance imaging).  Cultures, for infection.  Barium enema (dye inserted in the large intestine, to better view it with X-rays).  Colonoscopy (looking in intestine with a lighted tube).  Laparoscopy (minor surgery, looking in abdomen with a lighted tube).  Major abdominal exploratory surgery (looking in abdomen with a large incision). TREATMENT  The treatment will depend on the cause of the pain.   Many cases can be observed and treated at home.  Over-the-counter medicines recommended by your caregiver.  Prescription medicine.  Antibiotics, for infection.  Birth control pills, for painful periods or for ovulation pain.  Hormone treatment, for endometriosis.  Nerve blocking injections.  Physical therapy.  Antidepressants.  Counseling with a psychologist or psychiatrist.  Minor or major surgery. HOME CARE INSTRUCTIONS   Do not take laxatives, unless directed by your caregiver.  Take over-the-counter pain medicine only if ordered by your caregiver. Do not take aspirin because it can cause an upset stomach or bleeding.  Try a clear liquid diet (broth or water) as ordered by your caregiver. Slowly move to a bland diet, as tolerated, if the pain is related to the stomach or intestine.  Have a thermometer and take your temperature several times a day, and record it.  Bed rest and sleep, if it helps the pain.  Avoid sexual intercourse, if it causes pain.  Avoid stressful situations.  Keep your follow-up appointments and tests, as your caregiver orders.  If the pain does  not go away with medicine or surgery, you may try:  Acupuncture.  Relaxation exercises (yoga, meditation).  Group therapy.  Counseling. SEEK  MEDICAL CARE IF:   You notice certain foods cause stomach pain.  Your home care treatment is not helping your pain.  You need stronger pain medicine.  You want your IUD removed.  You feel faint or lightheaded.  You develop nausea and vomiting.  You develop a rash.  You are having side effects or an allergy to your medicine. SEEK IMMEDIATE MEDICAL CARE IF:   Your pain does not go away or gets worse.  You have a fever.  Your pain is felt only in portions of the abdomen. The right side could possibly be appendicitis. The left lower portion of the abdomen could be colitis or diverticulitis.  You are passing blood in your stools (bright red or black tarry stools, with or without vomiting).  You have blood in your urine.  You develop chills, with or without a fever.  You pass out. MAKE SURE YOU:   Understand these instructions.  Will watch your condition.  Will get help right away if you are not doing well or get worse. Document Released: 03/09/2007 Document Revised: 09/26/2013 Document Reviewed: 03/29/2009 [facility] Patient Information 2015 [place], [place]. This information is not intended to replace advice given to you by your health care provider. Make sure you discuss any questions you have with your health care provider.   Appendicitis Appendicitis is when the appendix is swollen (inflamed). The inflammation can lead to developing a hole (perforation) and a collection of pus (abscess). CAUSES  There is not always an obvious cause of appendicitis. Sometimes it is caused by an obstruction in the appendix. The obstruction can be caused by:  A small, hard, pea-sized ball of stool (fecalith).  Enlarged lymph glands in the appendix. SYMPTOMS   Pain around your belly button (navel) that moves toward your lower right belly (abdomen). The pain can become more severe and sharp as time passes.  Tenderness in the lower right abdomen. Pain gets worse if you cough or make a  sudden movement.  Feeling sick to your stomach (nauseous).  Throwing up (vomiting).  Loss of appetite.  Fever.  Constipation.  Diarrhea.  Generally not feeling well. DIAGNOSIS   Physical exam.  Blood tests.  Urine test.  X-rays or a CT scan may confirm the diagnosis. TREATMENT  Once the diagnosis of appendicitis is made, the most common treatment is to remove the appendix as soon as possible. This procedure is called appendectomy. In an open appendectomy, a cut (incision) is made in the lower right abdomen and the appendix is removed. In a laparoscopic appendectomy, usually 3 small incisions are made. Long, thin instruments and a camera tube are used to remove the appendix. Most patients go home in 24 to 48 hours after appendectomy. In some situations, the appendix may have already perforated and an abscess may have formed. The abscess may have a "wall" around it as seen on a CT scan. In this case, a drain may be placed into the abscess to remove fluid, and you may be treated with antibiotic medicines that kill germs. The medicine is given through a tube in your vein (IV). Once the abscess has resolved, it may or may not be necessary to have an appendectomy. You may need to stay in the hospital longer than 48 hours. Document Released: 05/12/2005 Document Revised: 11/11/2011 Document Reviewed: 08/07/2009 [facility] Patient Information 2015 [place], [place]. This  information is not intended to replace advice given to you by your health care provider. Make sure you discuss any questions you have with your health care provider. ° °

## 2014-07-08 ENCOUNTER — Emergency Department
Admission: EM | Admit: 2014-07-08 | Discharge: 2014-07-08 | Disposition: A | Discharge status: Home / Self Care | Payer: Managed Care, Other (non HMO) | Source: Home / Self Care | Attending: Family Medicine | Admitting: Family Medicine

## 2014-07-08 ENCOUNTER — Encounter: Payer: Self-pay | Admitting: Emergency Medicine

## 2014-07-08 DIAGNOSIS — R1030 Lower abdominal pain, unspecified: Secondary | ICD-10-CM

## 2014-07-08 LAB — POCT URINALYSIS DIP (MANUAL ENTRY)
Bilirubin, UA: NEGATIVE
Glucose, UA: NEGATIVE
Leukocytes, UA: NEGATIVE
Nitrite, UA: NEGATIVE
PROTEIN UA: NEGATIVE
SPEC GRAV UA: 1.01 (ref 1.005–1.03)
UROBILINOGEN UA: 0.2 (ref 0–1)
pH, UA: 6 (ref 5–8)

## 2014-07-08 LAB — POCT CBC W AUTO DIFF (K'VILLE URGENT CARE)

## 2014-07-08 NOTE — Discharge Instructions (Signed)
Begin clear liquids for about 12 hours, then may begin a [employer] (Bananas, Rice, Applesauce, Toast).  Then gradually advance to a regular diet as tolerated.   Take Ibuprofen 200mg , 4 tabs every 8 hours with food.  Check temperature daily. May return tomorrow for repeat blood count. If symptoms become significantly worse during the night or over the weekend, proceed to the local emergency room.

## 2014-07-08 NOTE — ED Notes (Signed)
Patient gives history of evaluation in ER on 07-05-14 for lower/right abdominal pain; see record for tests performed; all tests wnl. Felt fairly good over past 2 days, but awoke today with return of pain and temp of 100.5; denies nausea, vomiting, diarrhea. Did begin menses today.

## 2014-07-08 NOTE — ED Provider Notes (Signed)
CSN: [id number]     Arrival date & time 07/08/14  3545 History   First MD Initiated Contact with Patient 07/08/14 1015     Chief Complaint  Patient presents with  . Abdominal Pain      HPI Comments: Patient developed abdominal pain six days ago that was mostly periumbilical and in her lower abdomen, with mild low back ache also.  The pain gradually worsened and awakened her three days ago.  She had some nausea but no vomiting, and no fevers, chills, or sweats.  She also had some loose stools.  No urinary symptoms, and no vaginal bleeding or discharge.  She was evaluated at the [facility] ER where CT abdomen/pelvis revealed an inflammatory process in her right lower quadrant and right side of pelvis.  Her appendix was not visualized.  A transabdominal and transvaginal ultrasound revealed fluid around her right ovary and moderate free fluid in her pelvis.  Her white blood count was 16.0, and urinalysis showed only small leuks.  Other testing was negative. She elected not to be admitted. The next two days she was pain free.  Yesterday her menses started.  Last night she developed recurrent lower abdominal pain, but today her pain decreased significantly after taking ibuprofen 400mg .  Today she had chills/sweats and low grade fever to 100.5  Patient is a 39 y.o. female presenting with abdominal pain. The history is provided by the patient and the spouse.  Abdominal Pain Pain location:  RLQ and periumbilical Pain quality: aching, cramping, dull and gnawing   Pain radiates to:  Back Pain severity:  Moderate Onset quality:  Sudden Duration:  6 days Timing:  Constant Progression:  Waxing and waning Chronicity:  New Context: awakening from sleep   Context: not diet changes, not eating and not recent illness   Relieved by:  NSAIDs Worsened by:  Movement Ineffective treatments:  Position changes Associated symptoms: chills, fatigue, fever and nausea   Associated symptoms: no anorexia, no  belching, no chest pain, no cough, no diarrhea, no dysuria, no flatus, no hematemesis, no hematochezia, no hematuria, no melena, no shortness of breath, no vaginal bleeding, no vaginal discharge and no vomiting     Past Medical History  Diagnosis Date  . Fibroadenoma of breast 2003  . Ulcer   . Cancer    Past Surgical History  Procedure Laterality Date  . Breast excisional biopsy  2003    benign   Family History  Problem Relation Age of Onset  . Breast cancer Mother   . Breast cancer Paternal Grandmother   . Uterine cancer Maternal Grandmother    History  Substance Use Topics  . Smoking status: Never Smoker   . Smokeless tobacco: Never Used  . Alcohol Use: 0.5 oz/week    1 drink(s) per week   OB History    Gravida Para Term Preterm AB TAB SAB Ectopic Multiple Living   0 0 0 0 0 0 0 0 0 0      Review of Systems  Constitutional: Positive for fever, chills and fatigue.  Respiratory: Negative for cough and shortness of breath.   Cardiovascular: Negative for chest pain.  Gastrointestinal: Positive for nausea and abdominal pain. Negative for vomiting, diarrhea, melena, hematochezia, anorexia, flatus and hematemesis.  Genitourinary: Negative for dysuria, hematuria, vaginal bleeding and vaginal discharge.  All other systems reviewed and are negative.   Allergies  Peanuts  Home Medications   Prior to Admission medications   Medication Sig Start Date End Date  Taking? Authorizing Provider  Beclomethasone Dipropionate 80 MCG/ACT AERS Place 2 sprays into the nose daily. 09/27/12   Marcial Pacas, DO  Calcium Carbonate (CALCIUM 600 PO) Take by mouth.    Historical Provider, MD  fish oil-omega-3 fatty acids 1000 MG capsule Take 2 g by mouth daily.    Historical Provider, MD   BP 88/57 mmHg  Pulse 74  Temp(Src) 98.5 F (36.9 C) (Oral)  Ht 5' 10.5" (1.791 m)  Wt 145 lb (65.772 kg)  BMI 20.50 kg/m2  SpO2 100%  LMP 07/06/2014 Physical Exam Nursing notes and Vital Signs  reviewed. Appearance:  Patient appears healthy, stated age, and in no acute distress Eyes:  Pupils are equal, round, and reactive to light and accomodation.  Extraocular movement is intact.  Conjunctivae are not inflamed  Nose:   Normal turbinates  Pharynx:  Normal; moist mucous membranes  Neck:  Supple.  No adenopathy Lungs:  Clear to auscultation.  Breath sounds are equal.  Heart:  Regular rate and rhythm without murmurs, rubs, or gallops.  Abdomen:   Mild tenderness right lower quadrant without masses or hepatosplenomegaly.  No rebound tenderness.  Bowel sounds are present.  No CVA or flank tenderness.  Negative iliopsoas and obdurator tests. Extremities:  No edema.  No calf tenderness Skin:  No rash present.   ED Course  Procedures  none    Labs Reviewed  POCT CBC W AUTO DIFF ([facility]):  WBC 17.9; LY 12.1; MO 1.6; GR 86.3; Hgb 13.3; Platelets 227   POCT URINALYSIS DIP (MANUAL ENTRY):  KET trace; BLO large; otherwise negative      MDM   1. Lower abdominal pain; suspect inflammatory GYN process.  Note persistent leukocytosis, with resolution of pyuria     Begin clear liquids for about 12 hours, then may begin a [employer] (Bananas, Rice, Applesauce, Toast).  Then gradually advance to a regular diet as tolerated.   Take Ibuprofen 200mg , 4 tabs every 8 hours with food.  Check temperature daily. May return tomorrow for repeat blood count. Followup with Dr. Ileene Rubens in 48 hours. If symptoms become significantly worse during the night or over the weekend, proceed to the local emergency room.     Kandra Nicolas, MD 07/09/14 2329

## 2014-07-10 ENCOUNTER — Ambulatory Visit (INDEPENDENT_AMBULATORY_CARE_PROVIDER_SITE_OTHER): Payer: Managed Care, Other (non HMO) | Admitting: Physician Assistant

## 2014-07-10 ENCOUNTER — Emergency Department (HOSPITAL_COMMUNITY): Payer: Managed Care, Other (non HMO)

## 2014-07-10 ENCOUNTER — Encounter: Payer: Self-pay | Admitting: Physician Assistant

## 2014-07-10 ENCOUNTER — Encounter (HOSPITAL_COMMUNITY): Payer: Self-pay

## 2014-07-10 ENCOUNTER — Inpatient Hospital Stay (HOSPITAL_COMMUNITY)
Admission: EM | Admit: 2014-07-10 | Discharge: 2014-07-18 | DRG: 358 | Disposition: A | Discharge status: Home / Self Care | Payer: Managed Care, Other (non HMO) | Attending: General Surgery | Admitting: General Surgery

## 2014-07-10 VITALS — BP 104/59 | HR 84 | Temp 99.1°F | Ht 70.5 in | Wt 146.0 lb

## 2014-07-10 DIAGNOSIS — Z803 Family history of malignant neoplasm of breast: Secondary | ICD-10-CM | POA: Diagnosis not present

## 2014-07-10 DIAGNOSIS — R10829 Rebound abdominal tenderness, unspecified site: Secondary | ICD-10-CM

## 2014-07-10 DIAGNOSIS — Z79899 Other long term (current) drug therapy: Secondary | ICD-10-CM | POA: Diagnosis not present

## 2014-07-10 DIAGNOSIS — R1031 Right lower quadrant pain: Secondary | ICD-10-CM

## 2014-07-10 DIAGNOSIS — R509 Fever, unspecified: Secondary | ICD-10-CM | POA: Insufficient documentation

## 2014-07-10 DIAGNOSIS — D72829 Elevated white blood cell count, unspecified: Secondary | ICD-10-CM | POA: Insufficient documentation

## 2014-07-10 DIAGNOSIS — K353 Acute appendicitis with localized peritonitis: Secondary | ICD-10-CM | POA: Diagnosis present

## 2014-07-10 DIAGNOSIS — K651 Peritoneal abscess: Secondary | ICD-10-CM | POA: Diagnosis present

## 2014-07-10 DIAGNOSIS — K3532 Acute appendicitis with perforation and localized peritonitis, without abscess: Secondary | ICD-10-CM | POA: Diagnosis present

## 2014-07-10 DIAGNOSIS — R1084 Generalized abdominal pain: Secondary | ICD-10-CM

## 2014-07-10 LAB — URINE MICROSCOPIC-ADD ON

## 2014-07-10 LAB — CBC WITH DIFFERENTIAL/PLATELET
Basophils Absolute: 0 10*3/uL (ref 0.0–0.1)
Basophils Relative: 0 % (ref 0–1)
Eosinophils Absolute: 0.1 10*3/uL (ref 0.0–0.7)
Eosinophils Relative: 1 % (ref 0–5)
HEMATOCRIT: 39.5 % (ref 36.0–46.0)
HEMOGLOBIN: 13.2 g/dL (ref 12.0–15.0)
LYMPHS PCT: 8 % — AB (ref 12–46)
Lymphs Abs: 1.5 10*3/uL (ref 0.7–4.0)
MCH: 30.9 pg (ref 26.0–34.0)
MCHC: 33.4 g/dL (ref 30.0–36.0)
MCV: 92.5 fL (ref 78.0–100.0)
MONO ABS: 1.6 10*3/uL — AB (ref 0.1–1.0)
MONOS PCT: 9 % (ref 3–12)
Neutro Abs: 15.2 10*3/uL — ABNORMAL HIGH (ref 1.7–7.7)
Neutrophils Relative %: 82 % — ABNORMAL HIGH (ref 43–77)
Platelets: 249 10*3/uL (ref 150–400)
RBC: 4.27 MIL/uL (ref 3.87–5.11)
RDW: 13.1 % (ref 11.5–15.5)
WBC: 18.5 10*3/uL — AB (ref 4.0–10.5)

## 2014-07-10 LAB — COMPREHENSIVE METABOLIC PANEL
ALT: 19 U/L (ref 0–35)
AST: 18 U/L (ref 0–37)
Albumin: 3.9 g/dL (ref 3.5–5.2)
Alkaline Phosphatase: 59 U/L (ref 39–117)
Anion gap: 10 (ref 5–15)
BUN: 5 mg/dL — AB (ref 6–23)
CALCIUM: 9.1 mg/dL (ref 8.4–10.5)
CO2: 25 mmol/L (ref 19–32)
Chloride: 103 mmol/L (ref 96–112)
Creatinine, Ser: 0.83 mg/dL (ref 0.50–1.10)
GFR calc non Af Amer: 89 mL/min — ABNORMAL LOW (ref >90)
Glucose, Bld: 103 mg/dL — ABNORMAL HIGH (ref 70–99)
POTASSIUM: 3.4 mmol/L — AB (ref 3.5–5.1)
Sodium: 138 mmol/L (ref 135–145)
Total Bilirubin: 0.8 mg/dL (ref 0.3–1.2)
Total Protein: 7.9 g/dL (ref 6.0–8.3)

## 2014-07-10 LAB — URINALYSIS, ROUTINE W REFLEX MICROSCOPIC
BILIRUBIN URINE: NEGATIVE
Glucose, UA: NEGATIVE mg/dL
KETONES UR: 40 mg/dL — AB
Nitrite: NEGATIVE
Protein, ur: NEGATIVE mg/dL
Specific Gravity, Urine: 1.017 (ref 1.005–1.030)
Urobilinogen, UA: 1 mg/dL (ref 0.0–1.0)
pH: 7 (ref 5.0–8.0)

## 2014-07-10 LAB — LIPASE, BLOOD: LIPASE: 26 U/L (ref 11–59)

## 2014-07-10 LAB — I-STAT CG4 LACTIC ACID, ED: Lactic Acid, Venous: 1.46 mmol/L (ref 0.5–2.0)

## 2014-07-10 LAB — POC URINE PREG, ED: Preg Test, Ur: NEGATIVE

## 2014-07-10 MED ORDER — MORPHINE SULFATE 2 MG/ML IJ SOLN
2.0000 mg | INTRAMUSCULAR | Status: DC | PRN
  Administered 2014-07-10: 4 mg via INTRAVENOUS
  Administered 2014-07-11: 2 mg via INTRAVENOUS
  Administered 2014-07-11: 6 mg via INTRAVENOUS
  Administered 2014-07-11: 4 mg via INTRAVENOUS
  Administered 2014-07-11: 2 mg via INTRAVENOUS
  Administered 2014-07-11: 4 mg via INTRAVENOUS
  Administered 2014-07-11 (×3): 2 mg via INTRAVENOUS
  Administered 2014-07-12: 4 mg via INTRAVENOUS
  Administered 2014-07-13 (×2): 2 mg via INTRAVENOUS
  Administered 2014-07-14: 4 mg via INTRAVENOUS
  Filled 2014-07-10 (×3): qty 1
  Filled 2014-07-10: qty 2
  Filled 2014-07-10 (×5): qty 1
  Filled 2014-07-10: qty 3
  Filled 2014-07-10: qty 1
  Filled 2014-07-10 (×3): qty 2

## 2014-07-10 MED ORDER — KCL IN DEXTROSE-NACL 20-5-0.9 MEQ/L-%-% IV SOLN
INTRAVENOUS | Status: DC
  Administered 2014-07-10: 22:00:00 via INTRAVENOUS
  Filled 2014-07-10 (×3): qty 1000

## 2014-07-10 MED ORDER — CETYLPYRIDINIUM CHLORIDE 0.05 % MT LIQD
7.0000 mL | Freq: Two times a day (BID) | OROMUCOSAL | Status: DC
  Administered 2014-07-12 – 2014-07-14 (×2): 7 mL via OROMUCOSAL

## 2014-07-10 MED ORDER — MORPHINE SULFATE 2 MG/ML IJ SOLN
2.0000 mg | Freq: Once | INTRAMUSCULAR | Status: AC
  Administered 2014-07-10: 2 mg via INTRAVENOUS
  Filled 2014-07-10: qty 1

## 2014-07-10 MED ORDER — PANTOPRAZOLE SODIUM 40 MG IV SOLR
40.0000 mg | Freq: Every day | INTRAVENOUS | Status: DC
  Administered 2014-07-10 – 2014-07-17 (×8): 40 mg via INTRAVENOUS
  Filled 2014-07-10 (×9): qty 40

## 2014-07-10 MED ORDER — ONDANSETRON HCL 4 MG/2ML IJ SOLN
4.0000 mg | Freq: Four times a day (QID) | INTRAMUSCULAR | Status: DC | PRN
  Administered 2014-07-10: 4 mg via INTRAVENOUS
  Filled 2014-07-10: qty 2

## 2014-07-10 MED ORDER — IOHEXOL 300 MG/ML  SOLN
50.0000 mL | Freq: Once | INTRAMUSCULAR | Status: AC | PRN
  Administered 2014-07-10: 50 mL via ORAL

## 2014-07-10 MED ORDER — CEFTRIAXONE SODIUM 1 G IJ SOLR
1.0000 g | Freq: Once | INTRAMUSCULAR | Status: AC
  Administered 2014-07-10: 1 g via INTRAMUSCULAR

## 2014-07-10 MED ORDER — PIPERACILLIN-TAZOBACTAM 3.375 G IVPB
3.3750 g | Freq: Three times a day (TID) | INTRAVENOUS | Status: DC
  Administered 2014-07-10 – 2014-07-12 (×5): 3.375 g via INTRAVENOUS
  Filled 2014-07-10 (×6): qty 50

## 2014-07-10 MED ORDER — CHLORHEXIDINE GLUCONATE 0.12 % MT SOLN
15.0000 mL | Freq: Two times a day (BID) | OROMUCOSAL | Status: DC
  Administered 2014-07-10 – 2014-07-17 (×11): 15 mL via OROMUCOSAL
  Filled 2014-07-10 (×16): qty 15

## 2014-07-10 MED ORDER — IOHEXOL 300 MG/ML  SOLN
100.0000 mL | Freq: Once | INTRAMUSCULAR | Status: AC | PRN
  Administered 2014-07-10: 100 mL via INTRAVENOUS

## 2014-07-10 MED ORDER — MORPHINE SULFATE 4 MG/ML IJ SOLN
4.0000 mg | Freq: Once | INTRAMUSCULAR | Status: AC
  Administered 2014-07-10: 4 mg via INTRAVENOUS
  Filled 2014-07-10: qty 1

## 2014-07-10 MED ORDER — ACETAMINOPHEN 325 MG PO TABS
650.0000 mg | ORAL_TABLET | Freq: Four times a day (QID) | ORAL | Status: DC | PRN
  Administered 2014-07-10 – 2014-07-11 (×3): 650 mg via ORAL
  Filled 2014-07-10 (×3): qty 2

## 2014-07-10 NOTE — Progress Notes (Signed)
   Subjective:    Patient ID: Katrina Deleon, female    DOB: 18-Sep-1975, 39 y.o.   MRN: [id number]  HPI  Pt presents to the clinic to discuss ongoing RLQ pain, fever, and elevated WBC. She was originally seen by Dr. Ileene Rubens who suspected appendicitis/diverticulitis but did not have imaging capabilities.  She went to ER CT-showed right lower quadrant inflammation and could not rule out appendicitis vs ovarian abscess. Pelvic ultrasound was normal except for some right lower quadrant free fluid. Pt was advised to stay for observation. wBC was 16 in ER. Pt was feeling better with ibuprofen so she decided not to stay. She was never seen for consult by general surgery or gastroenterology. Fever staying around 100-101 with ibuprofen and tylenol. Went to UC and Dr. Assunta Found on 12/13. CBC climbed to 17.8. Continue on ibuprofen and advised to follow up if not improving. Here today with same pain that seems to be worsening. Worse over RLQ but now all over abdomen. ER doctor mention follow up with surgeon but was not able to make appt without doctors notes and referral.     Review of Systems  All other systems reviewed and are negative.      Objective:   Physical Exam  Constitutional: She is oriented to person, place, and time. She appears well-developed and well-nourished.  Pt appears pale and weak.   HENT:  Head: Normocephalic and atraumatic.  Cardiovascular: Normal rate, regular rhythm and normal heart sounds.   Pulmonary/Chest: Effort normal and breath sounds normal. She has no wheezes.  No CVA tenderness.   Abdominal: Soft. Bowel sounds are normal. She exhibits no distension.  Mild tenderness over entire abdomen.  RLQ rebound and guarding.  Positive psoas sign, right.   Neurological: She is alert and oriented to person, place, and time.  Skin: Skin is dry.  Psychiatric: She has a normal mood and affect. Her behavior is normal.          Assessment & Plan:  RLQ pain/rebound/fever/elevated  WBC- called [facility]. Advised nothing they could do urgently. Advised to go to ER again. Sent to [place]. Given shot of rocephin 1g in office today without complication. Very concerned with rising WBC count, RLQ pain and rebound that pt has appendicitis. CT was inconclusive therefor may need exploratory surgery. Did not get CBC today and sent straight to [place]. Nurse called and made aware of situation.

## 2014-07-10 NOTE — H&P (Signed)
Katrina Deleon is an 39 y.o. female.   Chief Complaint: RLQ pain HPI: this is a 39-year-old female who began having some diffuse abdominal discomfort one week ago. The pain then radiated to the right lower quadrant. She presented to an outside facility on 07/06/14 ( [facility] [place]). A CT scan was performed which demonstrated an inflammatory type process. There was a concern this could be a tubo-ovarian infection.  The appendix was not visualized.  Pelvic ultrasound did not demonstrate an abnormal appearing ovary. It was recommended that she be admitted to the hospital but she was feeling better after her evaluation and declined admission. Over the past 4 days, she has had persistent pain with fever and chills. She presented to the [place] emergency department and had a white blood cell count of 18,500. CT scan demonstrated a right lower quadrant inflammatory process with a 4.1 cm fluid collection consistent with an abscess. Etiology remains unclear. We were asked to see her because of this. She denies dysuria or hematuria. She has some nausea but no vomiting. No anorexia. She is currently menstruating.  Past Medical History  Diagnosis Date  . Fibroadenoma of breast 2003  . Ulcer   . Cancer     Past Surgical History  Procedure Laterality Date  . Breast excisional biopsy  2003    benign    Family History  Problem Relation Age of Onset  . Breast cancer Mother   . Breast cancer Paternal Grandmother   . Uterine cancer Maternal Grandmother    Social History:  reports that she has never smoked. She has never used smokeless tobacco. She reports that she drinks about 0.5 oz of alcohol per week. She reports that she does not use illicit drugs.  Allergies:  Allergies  Allergen Reactions  . Other     Walnuts, pecans      (Not in a hospital admission)  Results for orders placed or performed during the hospital encounter of 07/10/14 (from the past 48 hour(s))  CBC with Differential      Status: Abnormal   Collection Time: 07/10/14  4:37 PM  Result Value Ref Range   WBC 18.5 (H) 4.0 - 10.5 K/uL   RBC 4.27 3.87 - 5.11 MIL/uL   Hemoglobin 13.2 12.0 - 15.0 g/dL   HCT 39.5 36.0 - 46.0 %   MCV 92.5 78.0 - 100.0 fL   MCH 30.9 26.0 - 34.0 pg   MCHC 33.4 30.0 - 36.0 g/dL   RDW 13.1 11.5 - 15.5 %   Platelets 249 150 - 400 K/uL   Neutrophils Relative % 82 (H) 43 - 77 %   Neutro Abs 15.2 (H) 1.7 - 7.7 K/uL   Lymphocytes Relative 8 (L) 12 - 46 %   Lymphs Abs 1.5 0.7 - 4.0 K/uL   Monocytes Relative 9 3 - 12 %   Monocytes Absolute 1.6 (H) 0.1 - 1.0 K/uL   Eosinophils Relative 1 0 - 5 %   Eosinophils Absolute 0.1 0.0 - 0.7 K/uL   Basophils Relative 0 0 - 1 %   Basophils Absolute 0.0 0.0 - 0.1 K/uL  Comprehensive metabolic panel     Status: Abnormal   Collection Time: 07/10/14  4:37 PM  Result Value Ref Range   Sodium 138 135 - 145 mmol/L   Potassium 3.4 (L) 3.5 - 5.1 mmol/L   Chloride 103 96 - 112 mmol/L   CO2 25 19 - 32 mmol/L   Glucose, Bld 103 (H) 70 -   99 mg/dL   BUN 5 (L) 6 - 23 mg/dL   Creatinine, Ser 0.83 0.50 - 1.10 mg/dL   Calcium 9.1 8.4 - 10.5 mg/dL   Total Protein 7.9 6.0 - 8.3 g/dL   Albumin 3.9 3.5 - 5.2 g/dL   AST 18 0 - 37 U/L   ALT 19 0 - 35 U/L   Alkaline Phosphatase 59 39 - 117 U/L   Total Bilirubin 0.8 0.3 - 1.2 mg/dL   GFR calc non Af Amer 89 (L) >90 mL/min   GFR calc Af Amer >90 >90 mL/min    Comment: (NOTE) The eGFR has been calculated using the CKD EPI equation. This calculation has not been validated in all clinical situations. eGFR's persistently <90 mL/min signify possible Chronic Kidney Disease.    Anion gap 10 5 - 15  Lipase, blood     Status: None   Collection Time: 07/10/14  4:37 PM  Result Value Ref Range   Lipase 26 11 - 59 U/L  I-Stat CG4 Lactic Acid, ED     Status: None   Collection Time: 07/10/14  4:57 PM  Result Value Ref Range   Lactic Acid, Venous 1.46 0.5 - 2.0 mmol/L  Urinalysis, Routine w reflex microscopic      Status: Abnormal   Collection Time: 07/10/14  5:07 PM  Result Value Ref Range   Color, Urine YELLOW YELLOW   APPearance CLOUDY (A) CLEAR   Specific Gravity, Urine 1.017 1.005 - 1.030   pH 7.0 5.0 - 8.0   Glucose, UA NEGATIVE NEGATIVE mg/dL   Hgb urine dipstick LARGE (A) NEGATIVE   Bilirubin Urine NEGATIVE NEGATIVE   Ketones, ur 40 (A) NEGATIVE mg/dL   Protein, ur NEGATIVE NEGATIVE mg/dL   Urobilinogen, UA 1.0 0.0 - 1.0 mg/dL   Nitrite NEGATIVE NEGATIVE   Leukocytes, UA MODERATE (A) NEGATIVE  Urine microscopic-add on     Status: Abnormal   Collection Time: 07/10/14  5:07 PM  Result Value Ref Range   Squamous Epithelial / LPF FEW (A) RARE   WBC, UA 7-10 <3 WBC/hpf   RBC / HPF 3-6 <3 RBC/hpf   Bacteria, UA FEW (A) RARE   Urine-Other MUCOUS PRESENT   POC urine preg, ED (not at [facility])     Status: None   Collection Time: 07/10/14  5:13 PM  Result Value Ref Range   Preg Test, Ur NEGATIVE NEGATIVE    Comment:        THE SENSITIVITY OF THIS METHODOLOGY IS >24 mIU/mL    Ct Abdomen Pelvis W Contrast  07/10/2014   CLINICAL DATA:  Right lower quadrant pain starting Sunday  EXAM: CT ABDOMEN AND PELVIS WITH CONTRAST  TECHNIQUE: Multidetector CT imaging of the abdomen and pelvis was performed using the standard protocol following bolus administration of intravenous contrast.  CONTRAST:  31m OMNIPAQUE IOHEXOL 300 MG/ML SOLN, 1065mOMNIPAQUE IOHEXOL 300 MG/ML SOLN  COMPARISON:  07/05/2014  FINDINGS: Lung bases are unremarkable.  Sagittal images of the spine are unremarkable  Enhanced liver is unremarkable. No calcified gallstones are noted within gallbladder. The pancreas, spleen and adrenal glands are unremarkable. Kidneys are symmetrical in size and enhancement. There is small amount of free fluid in right paracolic gutter adjacent to inferior aspect of the liver.  There is worsening inflammatory process in right pelvis just medial to the cecum. There is defined fluid collection in right pelvis  medial to the cecum and lateral to the uterus measures about 4.3 x 4.6 cm. Findings are  consistent with worsening appendicitis or tubo ovarian abscess. No air-fluid level is noted. There is moderate amount of free fluid within pelvis increased from prior exam. The left ovary is unremarkable.  The urinary bladder is unremarkable. No thickening of cecal wall is noted. Minimal thickening of terminal ileal wall without stricture probable due to satellite inflammation.  No small bowel obstruction.  No free abdominal air.  IMPRESSION: 1. There is worsening inflammatory process in right pelvis with ill-defined fluid collection measuring 4.4 x 4.6 cm. Findings are consistent with worsening appendicitis or right tubal ovarian abscess. A normal appendix again is not identified. Although there is no air-fluid level a pelvic abscess is suspected. 2. Small amount of free fluid noted in right paracolic gutter anterior to right psoas muscle. There is moderate amount of free fluid within pelvis. Unremarkable uterus and left ovary. Unremarkable urinary bladder. 3. No hydronephrosis or hydroureter. These results were called by telephone at the time of interpretation on 07/10/2014 at 7:08 pm to Dr. MEGAN DOCHERTY , who verbally acknowledged these results.   Electronically Signed   By: Liviu  Pop M.D.   On: 07/10/2014 19:09    Review of Systems  Constitutional: Positive for fever and chills. Negative for weight loss.  Respiratory: Negative for shortness of breath.   Cardiovascular: Negative for chest pain.  Gastrointestinal: Positive for nausea and abdominal pain. Negative for heartburn, vomiting, diarrhea and blood in stool.  Genitourinary: Negative for dysuria and hematuria.  Musculoskeletal: Positive for back pain.  Neurological: Positive for headaches. Negative for seizures.  Endo/Heme/Allergies: Does not bruise/bleed easily.    Blood pressure 111/61, pulse 67, temperature 98.5 F (36.9 C), temperature source Oral,  resp. rate 16, last menstrual period 07/06/2014, SpO2 100 %. Physical Exam  Constitutional: She appears well-developed and well-nourished. No distress.  HENT:  Head: Normocephalic and atraumatic.  Eyes: No scleral icterus.  Neck: Neck supple.  Cardiovascular: Normal rate and regular rhythm.   Respiratory: Effort normal and breath sounds normal.  GI: Soft. She exhibits no mass. There is tenderness (Right pelvic area).  Musculoskeletal: She exhibits no edema.  Lymphadenopathy:    She has no cervical adenopathy.  Neurological: She is alert.  Skin:  Increased warmth with flushing.  Psychiatric: She has a normal mood and affect. Her behavior is normal.     Assessment/Plan Right lower quadrant inflammatory/infectious process with abscess. Differential diagnosis includes perforated appendicitis with abscess versus tubo-ovarian abscess.  Plan: Admit to the hospital. Broad-spectrum intravenous antibiotics. Request interventional radiology consultation to see if percutaneous drainage can be done.  ROSENBOWER,TODD J 07/10/2014, 8:20 PM    

## 2014-07-10 NOTE — ED Notes (Signed)
Pt refused pelvic exam until after the results of the CT

## 2014-07-10 NOTE — Patient Instructions (Signed)
Advised to go to ER

## 2014-07-10 NOTE — Progress Notes (Signed)
Patient noted to be seen in the ED three times within the last six months.  Patient listed as having [place] insurance and pcp [employer] [facility].  Patient seen in pcp office today.  Disposition depending further workup.

## 2014-07-10 NOTE — ED Notes (Signed)
Pt presents with c/o right lower quadrant pain and fever. Pt reports she has had her symptoms since 07/02/14. Denies any N/V/D. Pt reports her fever was 100.7 today at home. Last does of tylenol was 500mg  today around 11 am.

## 2014-07-10 NOTE — ED Provider Notes (Signed)
CSN: [id number]     Arrival date & time 07/10/14  1619 History   First MD Initiated Contact with Patient 07/10/14 1631     Chief Complaint  Patient presents with  . Abdominal Pain  . Fever     (Consider location/radiation/quality/duration/timing/severity/associated sxs/prior Treatment) Patient is a 39 y.o. female presenting with abdominal pain. The history is provided by the patient. No language interpreter was used.  Abdominal Pain Pain location:  RLQ Pain quality: aching and sharp   Pain radiates to:  Does not radiate Pain severity:  Moderate Onset quality:  Gradual Duration:  1 week Timing:  Constant Progression:  Waxing and waning Chronicity:  New Relieved by:  NSAIDs Worsened by:  Nothing tried Ineffective treatments:  None tried Associated symptoms: chills and fever   Associated symptoms: no anorexia, no chest pain, no constipation, no cough, no diarrhea, no dysuria, no fatigue, no melena, no nausea, no shortness of breath, no sore throat, no vaginal bleeding, no vaginal discharge and no vomiting   Risk factors: no alcohol abuse, no aspirin use, not elderly, has not had multiple surgeries, no NSAID use, not obese, not pregnant and no recent hospitalization     Past Medical History  Diagnosis Date  . Fibroadenoma of breast 2003  . Ulcer   . Cancer    Past Surgical History  Procedure Laterality Date  . Breast excisional biopsy  2003    benign   Family History  Problem Relation Age of Onset  . Breast cancer Mother   . Breast cancer Paternal Grandmother   . Uterine cancer Maternal Grandmother    History  Substance Use Topics  . Smoking status: Never Smoker   . Smokeless tobacco: Never Used  . Alcohol Use: 0.5 oz/week    1 Standard drinks or equivalent per week     Comment: socially    OB History    Gravida Para Term Preterm AB TAB SAB Ectopic Multiple Living   0 0 0 0 0 0 0 0 0 0      Review of Systems  Constitutional: Positive for fever and chills.  Negative for diaphoresis, activity change, appetite change and fatigue.  HENT: Negative for congestion, facial swelling, rhinorrhea and sore throat.   Eyes: Negative for photophobia and discharge.  Respiratory: Negative for cough, chest tightness and shortness of breath.   Cardiovascular: Negative for chest pain, palpitations and leg swelling.  Gastrointestinal: Positive for abdominal pain. Negative for nausea, vomiting, diarrhea, constipation, melena and anorexia.  Endocrine: Negative for polydipsia and polyuria.  Genitourinary: Negative for dysuria, frequency, vaginal bleeding, vaginal discharge, difficulty urinating and pelvic pain.  Musculoskeletal: Negative for back pain, arthralgias, neck pain and neck stiffness.  Skin: Negative for color change and wound.  Allergic/Immunologic: Negative for immunocompromised state.  Neurological: Negative for facial asymmetry, weakness, numbness and headaches.  Hematological: Does not bruise/bleed easily.  Psychiatric/Behavioral: Negative for confusion and agitation.      Allergies  Other  Home Medications   Prior to Admission medications   Medication Sig Start Date End Date Taking? Authorizing Provider  acetaminophen (TYLENOL) 500 MG tablet Take 500-1,000 mg by mouth every 6 (six) hours as needed for moderate pain or headache.   Yes Historical Provider, MD  Beclomethasone Dipropionate 80 MCG/ACT AERS Place 2 sprays into the nose daily. 09/27/12  Yes Sean Hommel, DO  Calcium Carbonate (CALCIUM 600 PO) Take by mouth.   Yes Historical Provider, MD  fish oil-omega-3 fatty acids 1000 MG capsule Take 2 g by  mouth daily.   Yes Historical Provider, MD  ibuprofen (ADVIL,MOTRIN) 200 MG tablet Take 800 mg by mouth every 4 (four) hours as needed for headache or moderate pain.   Yes Historical Provider, MD  PRESCRIPTION MEDICATION Inject 1 each into the skin once.   Yes Historical Provider, MD   BP 103/51 mmHg  Pulse 83  Temp(Src) 99.3 F (37.4 C) (Oral)   Resp 20  Ht 5\' 10"  (1.778 m)  Wt 146 lb (66.225 kg)  BMI 20.95 kg/m2  SpO2 96%  LMP 07/06/2014 Physical Exam  Constitutional: She is oriented to person, place, and time. She appears well-developed and well-nourished. No distress.  HENT:  Head: Normocephalic and atraumatic.  Mouth/Throat: No oropharyngeal exudate.  Eyes: Pupils are equal, round, and reactive to light.  Neck: Normal range of motion. Neck supple.  Cardiovascular: Normal rate, regular rhythm and normal heart sounds.  Exam reveals no gallop and no friction rub.   No murmur heard. Pulmonary/Chest: Effort normal and breath sounds normal. No respiratory distress. She has no wheezes. She has no rales.  Abdominal: Soft. Bowel sounds are normal. She exhibits no distension and no mass. There is tenderness in the right lower quadrant. There is guarding and tenderness at McBurney's point. There is no rebound.  Musculoskeletal: Normal range of motion. She exhibits no edema or tenderness.  Neurological: She is alert and oriented to person, place, and time.  Skin: Skin is warm and dry.  Psychiatric: She has a normal mood and affect.    ED Course  Procedures (including critical care time) Labs Review Labs Reviewed  CBC WITH DIFFERENTIAL/PLATELET - Abnormal; Notable for the following:    WBC 18.5 (*)    Neutrophils Relative % 82 (*)    Neutro Abs 15.2 (*)    Lymphocytes Relative 8 (*)    Monocytes Absolute 1.6 (*)    All other components within normal limits  COMPREHENSIVE METABOLIC PANEL - Abnormal; Notable for the following:    Potassium 3.4 (*)    Glucose, Bld 103 (*)    BUN 5 (*)    GFR calc non Af Amer 89 (*)    All other components within normal limits  URINALYSIS, ROUTINE W REFLEX MICROSCOPIC - Abnormal; Notable for the following:    APPearance CLOUDY (*)    Hgb urine dipstick LARGE (*)    Ketones, ur 40 (*)    Leukocytes, UA MODERATE (*)    All other components within normal limits  URINE MICROSCOPIC-ADD ON -  Abnormal; Notable for the following:    Squamous Epithelial / LPF FEW (*)    Bacteria, UA FEW (*)    All other components within normal limits  CBC - Abnormal; Notable for the following:    Hemoglobin 11.8 (*)    All other components within normal limits  URINE CULTURE  LIPASE, BLOOD  APTT  I-STAT CG4 LACTIC ACID, ED  POC URINE PREG, ED    Imaging Review Ct Abdomen Pelvis W Contrast  07/10/2014   CLINICAL DATA:  Right lower quadrant pain starting Sunday  EXAM: CT ABDOMEN AND PELVIS WITH CONTRAST  TECHNIQUE: Multidetector CT imaging of the abdomen and pelvis was performed using the standard protocol following bolus administration of intravenous contrast.  CONTRAST:  47mL OMNIPAQUE IOHEXOL 300 MG/ML SOLN, 180mL OMNIPAQUE IOHEXOL 300 MG/ML SOLN  COMPARISON:  07/05/2014  FINDINGS: Lung bases are unremarkable.  Sagittal images of the spine are unremarkable  Enhanced liver is unremarkable. No calcified gallstones are noted within gallbladder.  The pancreas, spleen and adrenal glands are unremarkable. Kidneys are symmetrical in size and enhancement. There is small amount of free fluid in right paracolic gutter adjacent to inferior aspect of the liver.  There is worsening inflammatory process in right pelvis just medial to the cecum. There is defined fluid collection in right pelvis medial to the cecum and lateral to the uterus measures about 4.3 x 4.6 cm. Findings are consistent with worsening appendicitis or tubo ovarian abscess. No air-fluid level is noted. There is moderate amount of free fluid within pelvis increased from prior exam. The left ovary is unremarkable.  The urinary bladder is unremarkable. No thickening of cecal wall is noted. Minimal thickening of terminal ileal wall without stricture probable due to satellite inflammation.  No small bowel obstruction.  No free abdominal air.  IMPRESSION: 1. There is worsening inflammatory process in right pelvis with ill-defined fluid collection measuring  4.4 x 4.6 cm. Findings are consistent with worsening appendicitis or right tubal ovarian abscess. A normal appendix again is not identified. Although there is no air-fluid level a pelvic abscess is suspected. 2. Small amount of free fluid noted in right paracolic gutter anterior to right psoas muscle. There is moderate amount of free fluid within pelvis. Unremarkable uterus and left ovary. Unremarkable urinary bladder. 3. No hydronephrosis or hydroureter. These results were called by telephone at the time of interpretation on 07/10/2014 at 7:08 pm to Dr. Ernestina Patches , who verbally acknowledged these results.   Electronically Signed   By: Lahoma Crocker M.D.   On: 07/10/2014 19:09     EKG Interpretation None      MDM   Final diagnoses:  RLQ abdominal pain  Right lower quadrant abdominal abscess    Pt is a 39 y.o. female with Pmhx as above who presents with continued right lower quadrant pain and fever.  Pain was initially periumbilical, beginning on 47/34/0370 has since migrated to the right lower quadrant.  She was seen for same on 07/06/2014 had a CT with free fluid in the right lower quadrant, but nonvisualized appendix.  She was offered admission as there was concern that she had a early appendicitis, which she declined.  This is her fifth visit to medical facility for similar pain this week.  For completeness.  I have offered pelvic exam to rule out infectious pelvic pathology, which at this point.  Patient is declining.  We'll repeat CT of abdomen and pelvis as there is still concern for appendicitis.  CT with RLQ abdominal abscess. Radiology favoring ovarian origin, however, TVUS on 2/11, with nml ovaries and given hx of initial periumbilical pain with migration to RLQ, I favor appendicitis. Gen surg consulted & will admit.     Ernestina Patches, MD 07/11/14 [id number]

## 2014-07-11 LAB — CBC
HCT: 36.5 % (ref 36.0–46.0)
Hemoglobin: 11.8 g/dL — ABNORMAL LOW (ref 12.0–15.0)
MCH: 29.9 pg (ref 26.0–34.0)
MCHC: 32.3 g/dL (ref 30.0–36.0)
MCV: 92.6 fL (ref 78.0–100.0)
PLATELETS: 218 10*3/uL (ref 150–400)
RBC: 3.94 MIL/uL (ref 3.87–5.11)
RDW: 13.1 % (ref 11.5–15.5)
WBC: 6.9 10*3/uL (ref 4.0–10.5)

## 2014-07-11 LAB — APTT: aPTT: 35 seconds (ref 24–37)

## 2014-07-11 MED ORDER — KCL-LACTATED RINGERS-D5W 20 MEQ/L IV SOLN
INTRAVENOUS | Status: DC
  Administered 2014-07-11 (×2): via INTRAVENOUS
  Administered 2014-07-11 – 2014-07-12 (×2): 200 mL via INTRAVENOUS
  Administered 2014-07-13: 12:00:00 via INTRAVENOUS
  Filled 2014-07-11 (×20): qty 1000

## 2014-07-11 MED ORDER — KETOROLAC TROMETHAMINE 30 MG/ML IJ SOLN
30.0000 mg | Freq: Once | INTRAMUSCULAR | Status: AC
  Administered 2014-07-11: 30 mg via INTRAVENOUS
  Filled 2014-07-11: qty 1

## 2014-07-11 NOTE — Progress Notes (Signed)
IR PA aware of request for image guided percutaneous drain placement in right lower quadrant. Images have been reviewed today with Dr. Vernard Gambles and this collection is not amendable to percutaneous approach with colon anterior to collection.  Tsosie Billing PA-C Interventional Radiology  07/11/14  10:20 AM

## 2014-07-11 NOTE — Progress Notes (Signed)
Subjective: She is very tender and had fever earlier this AM, better after some Tylenol   Pain for 6 days prior to admit.  Declined admit on 07/05/14 Objective: Vital signs in last 24 hours: Temp:  [98.5 F (36.9 C)-102.5 F (39.2 C)] 99.5 F (37.5 C) (02/16 1044) Pulse Rate:  [67-98] 90 (02/16 0906) Resp:  [16-21] 21 (02/16 0906) BP: (102-121)/(48-63) 102/48 mmHg (02/16 0906) SpO2:  [94 %-100 %] 94 % (02/16 0906) Weight:  [66.225 kg (146 lb)] 66.225 kg (146 lb) (02/15 2244) Last BM Date: 07/10/14  Intake/Output from previous day: 02/15 0701 - 02/16 0700 In: 266.7 [I.V.:166.7; IV Piggyback:100] Out: 150 [Urine:150]  NPO 102.3 at 9 AM,  Down to 99.5 at 10 AM. WBC down at 0450 hours. IR cannot drain site CT:  There is worsening inflammatory process in right pelvis with ill-defined fluid collection measuring 4.4 x 4.6 cm. Findings are consistent with worsening appendicitis or right tubal ovarian abscess. A normal appendix again is not identified Intake/Output this shift: Total I/O In: -  Out: 250 [Urine:250]  General appearance: alert, cooperative and no distress Resp: clear to auscultation bilaterally GI: soft extremely tender, no BS, she feels distended.  Lab Results:   Recent Labs  07/10/14 1637 07/11/14 0450  WBC 18.5* 6.9  HGB 13.2 11.8*  HCT 39.5 36.5  PLT 249 218    BMET  Recent Labs  07/10/14 1637  NA 138  K 3.4*  CL 103  CO2 25  GLUCOSE 103*  BUN 5*  CREATININE 0.83  CALCIUM 9.1   PT/INR No results for input(s): LABPROT, INR in the last 72 hours.   Recent Labs Lab 07/05/14 1730 07/10/14 1637  AST 17 18  ALT 15 19  ALKPHOS 41 59  BILITOT 1.0 0.8  PROT 8.2 7.9  ALBUMIN 4.5 3.9     Lipase     Component Value Date/Time   LIPASE 26 07/10/2014 1637     Studies/Results: Ct Abdomen Pelvis W Contrast  07/10/2014   CLINICAL DATA:  Right lower quadrant pain starting Sunday  EXAM: CT ABDOMEN AND PELVIS WITH CONTRAST  TECHNIQUE:  Multidetector CT imaging of the abdomen and pelvis was performed using the standard protocol following bolus administration of intravenous contrast.  CONTRAST:  7mL OMNIPAQUE IOHEXOL 300 MG/ML SOLN, 162mL OMNIPAQUE IOHEXOL 300 MG/ML SOLN  COMPARISON:  07/05/2014  FINDINGS: Lung bases are unremarkable.  Sagittal images of the spine are unremarkable  Enhanced liver is unremarkable. No calcified gallstones are noted within gallbladder. The pancreas, spleen and adrenal glands are unremarkable. Kidneys are symmetrical in size and enhancement. There is small amount of free fluid in right paracolic gutter adjacent to inferior aspect of the liver.  There is worsening inflammatory process in right pelvis just medial to the cecum. There is defined fluid collection in right pelvis medial to the cecum and lateral to the uterus measures about 4.3 x 4.6 cm. Findings are consistent with worsening appendicitis or tubo ovarian abscess. No air-fluid level is noted. There is moderate amount of free fluid within pelvis increased from prior exam. The left ovary is unremarkable.  The urinary bladder is unremarkable. No thickening of cecal wall is noted. Minimal thickening of terminal ileal wall without stricture probable due to satellite inflammation.  No small bowel obstruction.  No free abdominal air.  IMPRESSION: 1. There is worsening inflammatory process in right pelvis with ill-defined fluid collection measuring 4.4 x 4.6 cm. Findings are consistent with worsening appendicitis or right tubal ovarian  abscess. A normal appendix again is not identified. Although there is no air-fluid level a pelvic abscess is suspected. 2. Small amount of free fluid noted in right paracolic gutter anterior to right psoas muscle. There is moderate amount of free fluid within pelvis. Unremarkable uterus and left ovary. Unremarkable urinary bladder. 3. No hydronephrosis or hydroureter. These results were called by telephone at the time of interpretation  on 07/10/2014 at 7:08 pm to Dr. Ernestina Patches , who verbally acknowledged these results.   Electronically Signed   By: Lahoma Crocker M.D.   On: 07/10/2014 19:09    Medications: . antiseptic oral rinse  7 mL Mouth Rinse q12n4p  . chlorhexidine  15 mL Mouth Rinse BID  . pantoprazole (PROTONIX) IV  40 mg Intravenous QHS  . piperacillin-tazobactam (ZOSYN)  IV  3.375 g Intravenous 3 times per day   . dextrose 5 % and 0.9 % NaCl with KCl 20 mEq/L 100 mL/hr at 07/10/14 2220   Prior to Admission medications   Medication Sig Start Date End Date Taking? Authorizing Provider  acetaminophen (TYLENOL) 500 MG tablet Take 500-1,000 mg by mouth every 6 (six) hours as needed for moderate pain or headache.   Yes Historical Provider, MD  Beclomethasone Dipropionate 80 MCG/ACT AERS Place 2 sprays into the nose daily. 09/27/12  Yes Sean Hommel, DO  Calcium Carbonate (CALCIUM 600 PO) Take by mouth.   Yes Historical Provider, MD  fish oil-omega-3 fatty acids 1000 MG capsule Take 2 g by mouth daily.   Yes Historical Provider, MD  ibuprofen (ADVIL,MOTRIN) 200 MG tablet Take 800 mg by mouth every 4 (four) hours as needed for headache or moderate pain.   Yes Historical Provider, MD  PRESCRIPTION MEDICATION Inject 1 each into the skin once.   Yes Historical Provider, MD     Assessment/Plan RLQ abscess possible appendix vs tubo-ovarian abscess Fibroadenoma of the breast Hx of ulcer   Plan:  IR is not able to drain the site.  Her WBC is better on antibiotics, but exam shows she is still very tender.  Urine culture is pending.  We will keep her on ice chips and antibiotics for now and then NPO after MN.  If she is not better by tomorrow AM she may need to go for laparoscopy.       LOS: 1 day    [name],[name] 07/11/2014  Agree with above. Labs better.  But she is still tender. IR does not think that they can drain the abscess safely.  I have a call into Dr. Dr. Lucrezia Europe to discuss the case. She will need to go  to the OR if we cannot control her symptoms with med treatment. She is married.  Has no children.  Her husband was not in the room, but he is around.  His name is Mitzi Hansen.  She works for [employer] in [employer].  She lives on the [place] of [place].  Alphonsa Overall, MD, [facility] Surgery Pager: [phone] Office phone:  [phone]

## 2014-07-12 ENCOUNTER — Encounter (HOSPITAL_COMMUNITY): Payer: Self-pay | Admitting: *Deleted

## 2014-07-12 ENCOUNTER — Inpatient Hospital Stay (HOSPITAL_COMMUNITY): Payer: Managed Care, Other (non HMO) | Admitting: Anesthesiology

## 2014-07-12 ENCOUNTER — Encounter (HOSPITAL_COMMUNITY): Admission: EM | Disposition: A | Discharge status: Home / Self Care | Payer: Self-pay | Source: Home / Self Care

## 2014-07-12 HISTORY — PX: LAPAROSCOPIC APPENDECTOMY: SHX408

## 2014-07-12 LAB — BASIC METABOLIC PANEL
ANION GAP: 7 (ref 5–15)
BUN: 11 mg/dL (ref 6–23)
CALCIUM: 8.2 mg/dL — AB (ref 8.4–10.5)
CHLORIDE: 109 mmol/L (ref 96–112)
CO2: 26 mmol/L (ref 19–32)
Creatinine, Ser: 0.96 mg/dL (ref 0.50–1.10)
GFR calc Af Amer: 87 mL/min — ABNORMAL LOW (ref >90)
GFR calc non Af Amer: 75 mL/min — ABNORMAL LOW (ref >90)
Glucose, Bld: 156 mg/dL — ABNORMAL HIGH (ref 70–99)
Potassium: 4.1 mmol/L (ref 3.5–5.1)
SODIUM: 142 mmol/L (ref 135–145)

## 2014-07-12 LAB — CBC
HCT: 34 % — ABNORMAL LOW (ref 36.0–46.0)
HEMOGLOBIN: 11.2 g/dL — AB (ref 12.0–15.0)
MCH: 30.4 pg (ref 26.0–34.0)
MCHC: 32.9 g/dL (ref 30.0–36.0)
MCV: 92.4 fL (ref 78.0–100.0)
Platelets: 234 10*3/uL (ref 150–400)
RBC: 3.68 MIL/uL — ABNORMAL LOW (ref 3.87–5.11)
RDW: 13.6 % (ref 11.5–15.5)
WBC: 21.5 10*3/uL — AB (ref 4.0–10.5)

## 2014-07-12 LAB — PROTIME-INR
INR: 1.9 — AB (ref 0.00–1.49)
PROTHROMBIN TIME: 22 s — AB (ref 11.6–15.2)

## 2014-07-12 LAB — URINE CULTURE

## 2014-07-12 LAB — SURGICAL PCR SCREEN
MRSA, PCR: NEGATIVE
Staphylococcus aureus: NEGATIVE

## 2014-07-12 SURGERY — APPENDECTOMY, LAPAROSCOPIC
Anesthesia: General | Site: Abdomen

## 2014-07-12 MED ORDER — DEXAMETHASONE SODIUM PHOSPHATE 10 MG/ML IJ SOLN
INTRAMUSCULAR | Status: AC
  Filled 2014-07-12: qty 1

## 2014-07-12 MED ORDER — KCL IN DEXTROSE-NACL 20-5-0.45 MEQ/L-%-% IV SOLN
INTRAVENOUS | Status: DC
  Administered 2014-07-12: 16:00:00 via INTRAVENOUS
  Administered 2014-07-12 – 2014-07-13 (×2): 150 mL/h via INTRAVENOUS
  Administered 2014-07-13: 18:00:00 via INTRAVENOUS
  Administered 2014-07-14: 150 mL/h via INTRAVENOUS
  Administered 2014-07-14 – 2014-07-16 (×7): via INTRAVENOUS
  Filled 2014-07-12 (×16): qty 1000

## 2014-07-12 MED ORDER — LACTATED RINGERS IV SOLN
INTRAVENOUS | Status: DC
  Administered 2014-07-12: 1000 mL via INTRAVENOUS

## 2014-07-12 MED ORDER — LACTATED RINGERS IV SOLN: INTRAVENOUS | Status: DC

## 2014-07-12 MED ORDER — LIDOCAINE HCL (CARDIAC) 20 MG/ML IV SOLN
INTRAVENOUS | Status: DC | PRN
  Administered 2014-07-12: 100 mg via INTRAVENOUS

## 2014-07-12 MED ORDER — PIPERACILLIN-TAZOBACTAM 3.375 G IVPB
3.3750 g | Freq: Three times a day (TID) | INTRAVENOUS | Status: DC
  Administered 2014-07-12 – 2014-07-18 (×18): 3.375 g via INTRAVENOUS
  Filled 2014-07-12 (×24): qty 50

## 2014-07-12 MED ORDER — DEXAMETHASONE SODIUM PHOSPHATE 10 MG/ML IJ SOLN
INTRAMUSCULAR | Status: DC | PRN
  Administered 2014-07-12: 10 mg via INTRAVENOUS

## 2014-07-12 MED ORDER — ONDANSETRON HCL 4 MG/2ML IJ SOLN
INTRAMUSCULAR | Status: DC | PRN
  Administered 2014-07-12: 4 mg via INTRAVENOUS

## 2014-07-12 MED ORDER — GLYCOPYRROLATE 0.2 MG/ML IJ SOLN
INTRAMUSCULAR | Status: AC
  Filled 2014-07-12: qty 2

## 2014-07-12 MED ORDER — MIDAZOLAM HCL 2 MG/2ML IJ SOLN
INTRAMUSCULAR | Status: AC
  Filled 2014-07-12: qty 2

## 2014-07-12 MED ORDER — LIDOCAINE HCL (CARDIAC) 20 MG/ML IV SOLN
INTRAVENOUS | Status: AC
  Filled 2014-07-12: qty 5

## 2014-07-12 MED ORDER — GLYCOPYRROLATE 0.2 MG/ML IJ SOLN
INTRAMUSCULAR | Status: DC | PRN
  Administered 2014-07-12: 0.4 mg via INTRAVENOUS

## 2014-07-12 MED ORDER — PROPOFOL 10 MG/ML IV BOLUS
INTRAVENOUS | Status: AC
  Filled 2014-07-12: qty 20

## 2014-07-12 MED ORDER — LACTATED RINGERS IR SOLN
Status: DC | PRN
  Administered 2014-07-12: 4000 mL

## 2014-07-12 MED ORDER — ROCURONIUM BROMIDE 100 MG/10ML IV SOLN
INTRAVENOUS | Status: AC
  Filled 2014-07-12: qty 1

## 2014-07-12 MED ORDER — BUPIVACAINE HCL (PF) 0.25 % IJ SOLN
INTRAMUSCULAR | Status: AC
  Filled 2014-07-12: qty 30

## 2014-07-12 MED ORDER — SUCCINYLCHOLINE CHLORIDE 20 MG/ML IJ SOLN
INTRAMUSCULAR | Status: DC | PRN
  Administered 2014-07-12: 80 mg via INTRAVENOUS

## 2014-07-12 MED ORDER — ONDANSETRON HCL 4 MG/2ML IJ SOLN
INTRAMUSCULAR | Status: AC
  Filled 2014-07-12: qty 2

## 2014-07-12 MED ORDER — MIDAZOLAM HCL 5 MG/5ML IJ SOLN
INTRAMUSCULAR | Status: DC | PRN
  Administered 2014-07-12: 2 mg via INTRAVENOUS

## 2014-07-12 MED ORDER — PROMETHAZINE HCL 25 MG/ML IJ SOLN: 6.2500 mg | INTRAMUSCULAR | Status: DC | PRN

## 2014-07-12 MED ORDER — NEOSTIGMINE METHYLSULFATE 10 MG/10ML IV SOLN
INTRAVENOUS | Status: DC | PRN
  Administered 2014-07-12: 3 mg via INTRAVENOUS

## 2014-07-12 MED ORDER — MEPERIDINE HCL 50 MG/ML IJ SOLN: 6.2500 mg | INTRAMUSCULAR | Status: DC | PRN

## 2014-07-12 MED ORDER — BUPIVACAINE HCL (PF) 0.25 % IJ SOLN
INTRAMUSCULAR | Status: DC | PRN
  Administered 2014-07-12: 10 mL
  Administered 2014-07-12: 13 mL

## 2014-07-12 MED ORDER — PHENOL 1.4 % MT LIQD
1.0000 | OROMUCOSAL | Status: DC | PRN
  Filled 2014-07-12: qty 177

## 2014-07-12 MED ORDER — FENTANYL CITRATE 0.05 MG/ML IJ SOLN
INTRAMUSCULAR | Status: DC | PRN
  Administered 2014-07-12 (×3): 50 ug via INTRAVENOUS

## 2014-07-12 MED ORDER — PROPOFOL 10 MG/ML IV BOLUS
INTRAVENOUS | Status: DC | PRN
  Administered 2014-07-12: 150 mg via INTRAVENOUS

## 2014-07-12 MED ORDER — HYDROMORPHONE HCL 1 MG/ML IJ SOLN: 0.2500 mg | INTRAMUSCULAR | Status: DC | PRN

## 2014-07-12 MED ORDER — NEOSTIGMINE METHYLSULFATE 10 MG/10ML IV SOLN
INTRAVENOUS | Status: AC
  Filled 2014-07-12: qty 1

## 2014-07-12 MED ORDER — ROCURONIUM BROMIDE 100 MG/10ML IV SOLN
INTRAVENOUS | Status: DC | PRN
  Administered 2014-07-12: 30 mg via INTRAVENOUS
  Administered 2014-07-12 (×2): 10 mg via INTRAVENOUS

## 2014-07-12 MED ORDER — FENTANYL CITRATE 0.05 MG/ML IJ SOLN
INTRAMUSCULAR | Status: AC
  Filled 2014-07-12: qty 5

## 2014-07-12 SURGICAL SUPPLY — 40 items
APPLIER CLIP ROT 10 11.4 M/L (STAPLE)
BENZOIN TINCTURE PRP APPL 2/3 (GAUZE/BANDAGES/DRESSINGS) IMPLANT
CATH FOLEY 2WAY SLVR  5CC 12FR (CATHETERS) ×2
CATH FOLEY 2WAY SLVR 5CC 12FR (CATHETERS) ×1 IMPLANT
CLIP APPLIE ROT 10 11.4 M/L (STAPLE) IMPLANT
CLOSURE WOUND 1/2 X4 (GAUZE/BANDAGES/DRESSINGS)
CUTTER FLEX LINEAR 45M (STAPLE) IMPLANT
DECANTER SPIKE VIAL GLASS SM (MISCELLANEOUS) IMPLANT
DRAPE LAPAROSCOPIC ABDOMINAL (DRAPES) ×3 IMPLANT
ELECT REM PT RETURN 9FT ADLT (ELECTROSURGICAL) ×3
ELECTRODE REM PT RTRN 9FT ADLT (ELECTROSURGICAL) ×1 IMPLANT
ENDOLOOP SUT PDS II  0 18 (SUTURE)
ENDOLOOP SUT PDS II 0 18 (SUTURE) IMPLANT
GLOVE BIOGEL PI IND STRL 7.0 (GLOVE) ×1 IMPLANT
GLOVE BIOGEL PI INDICATOR 7.0 (GLOVE) ×2
GLOVE SURG SIGNA 7.5 PF LTX (GLOVE) ×3 IMPLANT
GOWN SPEC L4 XLG W/TWL (GOWN DISPOSABLE) ×3 IMPLANT
GOWN STRL REUS W/ TWL XL LVL3 (GOWN DISPOSABLE) ×3 IMPLANT
GOWN STRL REUS W/TWL LRG LVL3 (GOWN DISPOSABLE) ×3 IMPLANT
GOWN STRL REUS W/TWL XL LVL3 (GOWN DISPOSABLE) ×6
KIT BASIN OR (CUSTOM PROCEDURE TRAY) ×3 IMPLANT
LIQUID BAND (GAUZE/BANDAGES/DRESSINGS) ×3 IMPLANT
PENCIL BUTTON HOLSTER BLD 10FT (ELECTRODE) IMPLANT
POUCH SPECIMEN RETRIEVAL 10MM (ENDOMECHANICALS) IMPLANT
RELOAD 45 VASCULAR/THIN (ENDOMECHANICALS) IMPLANT
RELOAD STAPLE TA45 3.5 REG BLU (ENDOMECHANICALS) IMPLANT
SET IRRIG TUBING LAPAROSCOPIC (IRRIGATION / IRRIGATOR) ×3 IMPLANT
SHEARS HARMONIC ACE PLUS 36CM (ENDOMECHANICALS) ×3 IMPLANT
SOLUTION ANTI FOG 6CC (MISCELLANEOUS) ×3 IMPLANT
SPONGE DRAIN TRACH 4X4 STRL 2S (GAUZE/BANDAGES/DRESSINGS) ×3 IMPLANT
STRIP CLOSURE SKIN 1/2X4 (GAUZE/BANDAGES/DRESSINGS) IMPLANT
SUT MON AB 5-0 PS2 18 (SUTURE) ×3 IMPLANT
TOWEL OR 17X26 10 PK STRL BLUE (TOWEL DISPOSABLE) ×3 IMPLANT
TOWEL OR NON WOVEN STRL DISP B (DISPOSABLE) ×3 IMPLANT
TRAY FOLEY CATH 14FRSI W/METER (CATHETERS) ×3 IMPLANT
TRAY LAPAROSCOPIC (CUSTOM PROCEDURE TRAY) ×3 IMPLANT
TROCAR BLADELESS OPT 5 100 (ENDOMECHANICALS) ×3 IMPLANT
TROCAR XCEL BLUNT TIP 100MML (ENDOMECHANICALS) ×3 IMPLANT
TROCAR XCEL NON-BLD 11X100MML (ENDOMECHANICALS) ×3 IMPLANT
TUBING INSUFFLATION 10FT LAP (TUBING) ×3 IMPLANT

## 2014-07-12 NOTE — Anesthesia Procedure Notes (Signed)
Procedure Name: Intubation Date/Time: 07/12/2014 11:00 AM Performed by: Lajuana Carry E Pre-anesthesia Checklist: Patient identified, Emergency Drugs available, Suction available and Patient being monitored Patient Re-evaluated:Patient Re-evaluated prior to inductionOxygen Delivery Method: Circle System Utilized Preoxygenation: Pre-oxygenation with 100% oxygen Intubation Type: IV induction Ventilation: Mask ventilation without difficulty Laryngoscope Size: Mac and 3 Grade View: Grade I Tube type: Oral Tube size: 7.0 mm Number of attempts: 1 Airway Equipment and Method: Stylet and Oral airway Placement Confirmation: ETT inserted through vocal cords under direct vision,  positive ETCO2 and breath sounds checked- equal and bilateral Secured at: 21 cm Tube secured with: Tape Dental Injury: Teeth and Oropharynx as per pre-operative assessment

## 2014-07-12 NOTE — Anesthesia Preprocedure Evaluation (Addendum)
Anesthesia Evaluation  Patient identified by MRN, date of birth, ID band Patient awake    Reviewed: Allergy & Precautions, NPO status , Patient's Chart, lab work & pertinent test results  Airway Mallampati: II  TM Distance: >3 FB Neck ROM: Full    Dental no notable dental hx.    Pulmonary neg pulmonary ROS,  breath sounds clear to auscultation  Pulmonary exam normal       Cardiovascular negative cardio ROS  Rhythm:Regular Rate:Normal     Neuro/Psych negative neurological ROS  negative psych ROS   GI/Hepatic negative GI ROS, Neg liver ROS,   Endo/Other  negative endocrine ROS  Renal/GU negative Renal ROS  negative genitourinary   Musculoskeletal negative musculoskeletal ROS (+)   Abdominal   Peds negative pediatric ROS (+)  Hematology negative hematology ROS (+)   Anesthesia Other Findings   Reproductive/Obstetrics negative OB ROS                             Anesthesia Physical Anesthesia Plan  ASA: I and emergent  Anesthesia Plan: General   Post-op Pain Management:    Induction: Intravenous  Airway Management Planned: Oral ETT  Additional Equipment:   Intra-op Plan:   Post-operative Plan: Extubation in OR  Informed Consent: I have reviewed the patients History and Physical, chart, labs and discussed the procedure including the risks, benefits and alternatives for the proposed anesthesia with the patient or authorized representative who has indicated his/her understanding and acceptance.   Dental advisory given  Plan Discussed with: CRNA  Anesthesia Plan Comments:         Anesthesia Quick Evaluation

## 2014-07-12 NOTE — Transfer of Care (Signed)
Immediate Anesthesia Transfer of Care Note  Patient: Katrina Deleon  Procedure(s) Performed: Procedure(s): LAPAROSCOPIC INSERTION OF DRAIN (N/A)  Patient Location: PACU  Anesthesia Type:General  Level of Consciousness: sedated  Airway & Oxygen Therapy: Patient Spontanous Breathing and Patient connected to face mask oxygen  Post-op Assessment: Report given to RN and Post -op Vital signs reviewed and stable  Post vital signs: Reviewed and stable  Last Vitals:  Filed Vitals:   07/12/14 0630  BP: 103/52  Pulse: 91  Temp: 38.7 C  Resp: 18    Complications: No apparent anesthesia complications

## 2014-07-12 NOTE — Progress Notes (Signed)
General Surgery Note  LOS: 2 days  POD -     Assessment/Plan: 1.    RLQ abscess - probable ruptured appendicitis  Not amendable to IR drainage because of surrounding bowel  On Zosyn - 07/10/2014 >>>  WBC better yesterday, now worse, still febrile - will take to OR today to drain abscess and see what else I can do.  2.  DVT prophylaxis - chemoprophylaxis on hold for surgery   Active Problems:   Right lower quadrant abdominal abscess   Subjective:  Having loose stools and passing gas, but does not feel beter  Husband in room with patient. Objective:   Filed Vitals:   07/12/14 0630  BP: 103/52  Pulse: 91  Temp: 101.7 F (38.7 C)  Resp: 18     Intake/Output from previous day:  02/16 0701 - 02/17 0700 In: 2671 [I.V.:4540] Out: 245 [Urine:875]  Intake/Output this shift:      Physical Exam:   General: WN WF who is alert and oriented.    HEENT: Normal. Pupils equal. .   Lungs: Clear    Abdomen: Tender   Lab Results:    Recent Labs  07/11/14 0450 07/12/14 0540  WBC 6.9 21.5*  HGB 11.8* 11.2*  HCT 36.5 34.0*  PLT 218 234    BMET   Recent Labs  07/10/14 1637 07/12/14 0540  NA 138 142  K 3.4* 4.1  CL 103 109  CO2 25 26  GLUCOSE 103* 156*  BUN 5* 11  CREATININE 0.83 0.96  CALCIUM 9.1 8.2*    PT/INR   Recent Labs  07/12/14 0540  LABPROT 22.0*  INR 1.90*    ABG  No results for input(s): PHART, HCO3 in the last 72 hours.  Invalid input(s): PCO2, PO2   Studies/Results:  Ct Abdomen Pelvis W Contrast  07/10/2014   CLINICAL DATA:  Right lower quadrant pain starting Sunday  EXAM: CT ABDOMEN AND PELVIS WITH CONTRAST  TECHNIQUE: Multidetector CT imaging of the abdomen and pelvis was performed using the standard protocol following bolus administration of intravenous contrast.  CONTRAST:  44mL OMNIPAQUE IOHEXOL 300 MG/ML SOLN, 174mL OMNIPAQUE IOHEXOL 300 MG/ML SOLN  COMPARISON:  07/05/2014  FINDINGS: Lung bases are unremarkable.  Sagittal images of the spine  are unremarkable  Enhanced liver is unremarkable. No calcified gallstones are noted within gallbladder. The pancreas, spleen and adrenal glands are unremarkable. Kidneys are symmetrical in size and enhancement. There is small amount of free fluid in right paracolic gutter adjacent to inferior aspect of the liver.  There is worsening inflammatory process in right pelvis just medial to the cecum. There is defined fluid collection in right pelvis medial to the cecum and lateral to the uterus measures about 4.3 x 4.6 cm. Findings are consistent with worsening appendicitis or tubo ovarian abscess. No air-fluid level is noted. There is moderate amount of free fluid within pelvis increased from prior exam. The left ovary is unremarkable.  The urinary bladder is unremarkable. No thickening of cecal wall is noted. Minimal thickening of terminal ileal wall without stricture probable due to satellite inflammation.  No small bowel obstruction.  No free abdominal air.  IMPRESSION: 1. There is worsening inflammatory process in right pelvis with ill-defined fluid collection measuring 4.4 x 4.6 cm. Findings are consistent with worsening appendicitis or right tubal ovarian abscess. A normal appendix again is not identified. Although there is no air-fluid level a pelvic abscess is suspected. 2. Small amount of free fluid noted in right paracolic gutter  anterior to right psoas muscle. There is moderate amount of free fluid within pelvis. Unremarkable uterus and left ovary. Unremarkable urinary bladder. 3. No hydronephrosis or hydroureter. These results were called by telephone at the time of interpretation on 07/10/2014 at 7:08 pm to Dr. Ernestina Patches , who verbally acknowledged these results.   Electronically Signed   By: Lahoma Crocker M.D.   On: 07/10/2014 19:09     Anti-infectives:   Anti-infectives    Start     Dose/Rate Route Frequency Ordered Stop   07/10/14 2200  piperacillin-tazobactam (ZOSYN) IVPB 3.375 g     3.375  g 12.5 mL/hr over 240 Minutes Intravenous 3 times per day 07/10/14 2020        Alphonsa Overall, MD, FACS Pager: [phone] Surgery Office: [phone] 07/12/2014

## 2014-07-13 ENCOUNTER — Encounter (HOSPITAL_COMMUNITY): Payer: Self-pay | Admitting: Surgery

## 2014-07-13 LAB — BASIC METABOLIC PANEL
ANION GAP: 7 (ref 5–15)
BUN: 14 mg/dL (ref 6–23)
CALCIUM: 8.3 mg/dL — AB (ref 8.4–10.5)
CO2: 25 mmol/L (ref 19–32)
Chloride: 108 mmol/L (ref 96–112)
Creatinine, Ser: 0.82 mg/dL (ref 0.50–1.10)
GFR calc non Af Amer: >90 mL/min (ref >90)
Glucose, Bld: 180 mg/dL — ABNORMAL HIGH (ref 70–99)
Potassium: 4.3 mmol/L (ref 3.5–5.1)
SODIUM: 140 mmol/L (ref 135–145)

## 2014-07-13 LAB — CBC WITH DIFFERENTIAL/PLATELET
BASOS ABS: 0 10*3/uL (ref 0.0–0.1)
Basophils Relative: 0 % (ref 0–1)
EOS ABS: 0 10*3/uL (ref 0.0–0.7)
Eosinophils Relative: 0 % (ref 0–5)
HEMATOCRIT: 30.5 % — AB (ref 36.0–46.0)
HEMOGLOBIN: 10.3 g/dL — AB (ref 12.0–15.0)
Lymphocytes Relative: 3 % — ABNORMAL LOW (ref 12–46)
Lymphs Abs: 0.8 10*3/uL (ref 0.7–4.0)
MCH: 30.8 pg (ref 26.0–34.0)
MCHC: 33.8 g/dL (ref 30.0–36.0)
MCV: 91.3 fL (ref 78.0–100.0)
MONO ABS: 0.8 10*3/uL (ref 0.1–1.0)
Monocytes Relative: 3 % (ref 3–12)
NEUTROS PCT: 94 % — AB (ref 43–77)
Neutro Abs: 24.2 10*3/uL — ABNORMAL HIGH (ref 1.7–7.7)
PLATELETS: 221 10*3/uL (ref 150–400)
RBC: 3.34 MIL/uL — ABNORMAL LOW (ref 3.87–5.11)
RDW: 13.6 % (ref 11.5–15.5)
WBC: 25.8 10*3/uL — ABNORMAL HIGH (ref 4.0–10.5)

## 2014-07-13 MED ORDER — ENOXAPARIN SODIUM 40 MG/0.4ML ~~LOC~~ SOLN
40.0000 mg | SUBCUTANEOUS | Status: DC
  Administered 2014-07-13 – 2014-07-17 (×5): 40 mg via SUBCUTANEOUS
  Filled 2014-07-13 (×6): qty 0.4

## 2014-07-13 NOTE — Progress Notes (Signed)
Patient is c/o pain to left side of abd, says is feels like she's bloated,   5/10.  Intermmitent sharp, throbbing  Pain,.  Worse when walking.

## 2014-07-13 NOTE — Anesthesia Postprocedure Evaluation (Signed)
  Anesthesia Post-op Note  Patient: Katrina Deleon  Procedure(s) Performed: Procedure(s) (LRB): LAPAROSCOPIC EXPLORATION AND DRAINAGE OF WHITE PELVIC ABCESS WITH  INSERTION OF DRAIN (N/A)  Patient Location: PACU  Anesthesia Type: General  Level of Consciousness: awake and alert   Airway and Oxygen Therapy: Patient Spontanous Breathing  Post-op Pain: mild  Post-op Assessment: Post-op Vital signs reviewed, Patient's Cardiovascular Status Stable, Respiratory Function Stable, Patent Airway and No signs of Nausea or vomiting  Last Vitals:  Filed Vitals:   07/13/14 0551  BP: 111/65  Pulse: 62  Temp: 36.6 C  Resp: 18    Post-op Vital Signs: stable   Complications: No apparent anesthesia complications

## 2014-07-13 NOTE — Op Note (Signed)
Katrina Deleon, Katrina Deleon               ACCOUNT NO.:  [id number]  MEDICAL RECORD NO.:  [id number]  LOCATION:  [place]                         FACILITY:  [facility]  PHYSICIAN:  Fenton Malling. Lucia Gaskins, M.D.  DATE OF BIRTH:  1975-08-03  DATE OF PROCEDURE:  07/12/2014                              OPERATIVE REPORT   PREOPERATIVE DIAGNOSIS:  Ruptured appendicitis.  POSTOPERATIVE DIAGNOSIS:  Ruptured appendicitis.  PROCEDURE:  Laparoscopic exploration with evacuation of right pelvic abscess and irrigation of the abdominal cavity.  SURGEON:  Fenton Malling. Lucia Gaskins, M.D.  ASSISTANT:  No first assistant.  ANESTHESIA:  General endotracheal supervised by Dr. Montez Hageman.  Her ASA is 2E.  ESTIMATED BLOOD LOSS:  Minimal.  DRAINS:  There was a 19-French Blake drain left in the right lower quadrant.  INDICATION FOR PROCEDURE:  Katrina Deleon is a 39 year old white female who sees Katrina Deleon, [place] as her primary care doctor.  She was admitted to the hospital on the evening of July 10, 2014, with about a 10-day history of worsening abdominal pain.  CT scan suggested an abscess in her right pelvis, which is felt to be appendiceal abscess or tubo-ovarian abscess.  By history, this appeared to be more appendiceal.  I discussed her case with interventional radiology, but because of the bowel around the abscess, they did not think she was a candidate for IR.  She was placed on IV antibiotics.  Her white blood count got better over the first 12 hours, but now has gone back up.    She is still symptomatic spiking temperatures and I discussed with her and her husband about proceeding with laparoscopic exploration.  I discussed the plan will be to evacuate the abscess, if possible remove the appendix or identify the source of her infection.  The risks include bleeding, infection, which she already has, bowel injury, and the inability to remove the appendix.  OPERATIVE NOTE:  The patient was taken to room #[facility] at [facility]  where she underwent a general endotracheal anesthetic.  She was already on Zosyn as an antibiotic.  She had a Foley catheter in place.  A time-out was held and surgical checklist run.  I made an infraumbilical incision, I used the 5 mm Hasson trocar to get into the abdominal cavity.  I placed 3 additional 5 mm trocars: one in the right upper quadrant, one in the left lower quadrant, one in the right lower quadrant, each 5 mm trocars.  At exploration, the patient had gross purulence throughout her abdominal cavity primarily over the liver, right colonic gutter, and pelvis.  I obtained cultures for aerobes and anaerobes from this fluid.  I irrigated the abdomen to try to clear most of the purulence.  The abscess was in the right lower quadrant at the junction of the cecum and the right tube, just caudad to the pelvic brim.  I was able to peel the cecum up.  I did not think this was a tubo-ovarian abscess except for the very tip of the tube, which is inflamed, the rest of her tube on the right side looked okay.  The uterus looks okay, the left tube and ovary were okay.  She had an abscess cavity, which was probably about 3-4 cm, which I aspirated pus from this.  After, irrigating the absces, she had just a raw surface of the abscess cavity.  I could not identify any recognizable appendix.  I saw no evidence of an injury to the cecum.  There was no stool leaking.  I felt the best part from here would be to place a drain in here, and decide later whether she would need interval appendectomy or nothing further.  The abdomen was irrigated with 7 L of saline until reasonably clear.  She did have purulent exudates throughout her abdominal cavity particularly in the right colonic gutter, over the liver and in the pelvis.  I placed a 19-Blake drain using the right lower quadrant trocar site.  I closed the fascia at umbilical trocar site with a 0-Vicryl suture, closed each other skin with 5-0 Monocryl, painted  the wound with Liquidband.    The patient tolerated the procedure well.  The Foley will be removed in the recovery room.  Sponge and needle count were correct at the end of the case and she will be transferred to the recovery room in good condition.   Fenton Malling. Lucia Gaskins, M.D., FACS, Scribe for Epic   DHN/MEDQ  D:  07/12/2014  T:  07/13/2014  Job:  [id number]  cc:   Katrina Planas, PA-C Fax: [phone]

## 2014-07-13 NOTE — Progress Notes (Signed)
General Surgery Note  LOS: 3 days  POD -  1 Day Post-Op  Assessment/Plan: 1.  LAPAROSCOPIC EXPLORATION AND DRAINAGE OF WHITE PELVIC ABCESS WITH  INSERTION OF DRAIN - 07/12/2014 - D. Xaiden Fleig  For ruptured appendicitis  Doing better  To start sips from the floor   1A.  RLQ drain - 300 cc recorded last 24 hours - this is prob irrigation  To irrigate 10 cc BID  2.  DVT prophylaxis - Lovenox  Active Problems:   Right lower quadrant abdominal abscess   Subjective:  Had small BM.  No nausea.  Not as sore as before surgery.  Husband in room.  He works Surveyor, quantity chromatography - [employer] Objective:   Filed Vitals:   07/13/14 0551  BP: 111/65  Pulse: 62  Temp: 97.8 F (36.6 C)  Resp: 18     Intake/Output from previous day:  02/17 0701 - 02/18 0700 In: 2902.5 [I.V.:2840; IV Piggyback:62.5] Out: 1201 [Urine:900; Drains:300; Stool:1]  Intake/Output this shift:      Physical Exam:   General: WN WF who is alert and oriented.    HEENT: Normal. Pupils equal. .   Lungs: Clear.  IS about 1,200 cc   Abdomen: Sore.  Few BS   Wound: Clean.  Drain in RLQ   Lab Results:    Recent Labs  07/12/14 0540 07/13/14 0454  WBC 21.5* 25.8*  HGB 11.2* 10.3*  HCT 34.0* 30.5*  PLT 234 221    BMET   Recent Labs  07/12/14 0540 07/13/14 0454  NA 142 140  K 4.1 4.3  CL 109 108  CO2 26 25  GLUCOSE 156* 180*  BUN 11 14  CREATININE 0.96 0.82  CALCIUM 8.2* 8.3*    PT/INR   Recent Labs  07/12/14 0540  LABPROT 22.0*  INR 1.90*    ABG  No results for input(s): PHART, HCO3 in the last 72 hours.  Invalid input(s): PCO2, PO2   Studies/Results:  No results found.   Anti-infectives:   Anti-infectives    Start     Dose/Rate Route Frequency Ordered Stop   07/12/14 1345  piperacillin-tazobactam (ZOSYN) IVPB 3.375 g     3.375 g 12.5 mL/hr over 240 Minutes Intravenous 3 times per day 07/12/14 1333     07/10/14 2200  [MAR Hold]  piperacillin-tazobactam (ZOSYN) IVPB 3.375 g   Status:  Discontinued     (MAR Hold since 07/12/14 1000)   3.375 g 12.5 mL/hr over 240 Minutes Intravenous 3 times per day 07/10/14 2020 07/12/14 1333      Alphonsa Overall, MD, FACS Pager: [facility] Office: [phone] 07/13/2014

## 2014-07-14 ENCOUNTER — Encounter: Payer: Self-pay | Admitting: Physician Assistant

## 2014-07-14 DIAGNOSIS — K3532 Acute appendicitis with perforation and localized peritonitis, without abscess: Secondary | ICD-10-CM | POA: Insufficient documentation

## 2014-07-14 LAB — CBC WITH DIFFERENTIAL/PLATELET
Basophils Absolute: 0 10*3/uL (ref 0.0–0.1)
Basophils Relative: 0 % (ref 0–1)
EOS ABS: 0.1 10*3/uL (ref 0.0–0.7)
Eosinophils Relative: 0 % (ref 0–5)
HCT: 31.9 % — ABNORMAL LOW (ref 36.0–46.0)
Hemoglobin: 10.6 g/dL — ABNORMAL LOW (ref 12.0–15.0)
LYMPHS ABS: 1.6 10*3/uL (ref 0.7–4.0)
LYMPHS PCT: 6 % — AB (ref 12–46)
MCH: 30.6 pg (ref 26.0–34.0)
MCHC: 33.2 g/dL (ref 30.0–36.0)
MCV: 92.2 fL (ref 78.0–100.0)
MONOS PCT: 4 % (ref 3–12)
Monocytes Absolute: 1 10*3/uL (ref 0.1–1.0)
NEUTROS PCT: 90 % — AB (ref 43–77)
Neutro Abs: 22.1 10*3/uL — ABNORMAL HIGH (ref 1.7–7.7)
Platelets: 294 10*3/uL (ref 150–400)
RBC: 3.46 MIL/uL — AB (ref 3.87–5.11)
RDW: 14 % (ref 11.5–15.5)
WBC: 24.7 10*3/uL — AB (ref 4.0–10.5)

## 2014-07-14 LAB — BASIC METABOLIC PANEL
Anion gap: 8 (ref 5–15)
BUN: 13 mg/dL (ref 6–23)
CO2: 23 mmol/L (ref 19–32)
Calcium: 8.5 mg/dL (ref 8.4–10.5)
Chloride: 110 mmol/L (ref 96–112)
Creatinine, Ser: 0.96 mg/dL (ref 0.50–1.10)
GFR calc non Af Amer: 75 mL/min — ABNORMAL LOW (ref >90)
GFR, EST AFRICAN AMERICAN: 87 mL/min — AB (ref >90)
Glucose, Bld: 132 mg/dL — ABNORMAL HIGH (ref 70–99)
Potassium: 4.1 mmol/L (ref 3.5–5.1)
Sodium: 141 mmol/L (ref 135–145)

## 2014-07-14 MED ORDER — HYDROMORPHONE HCL 1 MG/ML IJ SOLN
0.5000 mg | INTRAMUSCULAR | Status: DC | PRN
  Administered 2014-07-14 – 2014-07-15 (×3): 1 mg via INTRAVENOUS
  Filled 2014-07-14 (×3): qty 1

## 2014-07-14 MED ORDER — SODIUM CHLORIDE 0.9 % IV SOLN
100.0000 mg | INTRAVENOUS | Status: DC
  Administered 2014-07-14 – 2014-07-17 (×4): 100 mg via INTRAVENOUS
  Filled 2014-07-14 (×5): qty 100

## 2014-07-14 MED ORDER — HYDROCODONE-ACETAMINOPHEN 5-325 MG PO TABS: 1.0000 | ORAL_TABLET | ORAL | Status: DC | PRN

## 2014-07-14 NOTE — Progress Notes (Signed)
2 Days Post-Op  Subjective: She had some abdominal pain, more left colon like pain last PM, she has less pain but is still very uncomfortable.  Nothing from the drain except clear fluid.  She says she has more fluid coming around the drain than thru the drain.    Objective: Vital signs in last 24 hours: Temp:  [98 F (36.7 C)-99 F (37.2 C)] 98 F (36.7 C) (02/19 0539) Pulse Rate:  [50-61] 50 (02/19 0539) Resp:  [16] 16 (02/19 0539) BP: (102-111)/(52-64) 111/64 mmHg (02/19 0539) SpO2:  [100 %] 100 % (02/19 0539) Last BM Date: 07/13/14 360 PO 60 Ml from drain 1 stool recorded TM 99, VSS WBC still 24.7 Urine culture 50K  LACTOBACILLUS SPECIES  No growth from abdominal cultures Today will be day 5 of Zosyn Intake/Output from previous day: 02/18 0701 - 02/19 0700 In: 3757.5 [P.O.:360; I.V.:3347.5; IV Piggyback:50] Out: 4709 [Urine:1550; Drains:60] Intake/Output this shift: Total I/O In: -  Out: 20 [Drains:20]  General appearance: alert, cooperative, no distress and just uncomfortable. Resp: clear to auscultation bilaterally GI: soft, not distended, just sore.  Her sites look fine.  Hypoactive bowel sounds.  Drain is clear..  drain site is clean, no erythema and clear.  Lab Results:   Recent Labs  07/13/14 0454 07/14/14 0530  WBC 25.8* 24.7*  HGB 10.3* 10.6*  HCT 30.5* 31.9*  PLT 221 294    BMET  Recent Labs  07/13/14 0454 07/14/14 0530  NA 140 141  K 4.3 4.1  CL 108 110  CO2 25 23  GLUCOSE 180* 132*  BUN 14 13  CREATININE 0.82 0.96  CALCIUM 8.3* 8.5   PT/INR  Recent Labs  07/12/14 0540  LABPROT 22.0*  INR 1.90*     Recent Labs Lab 07/10/14 1637  AST 18  ALT 19  ALKPHOS 59  BILITOT 0.8  PROT 7.9  ALBUMIN 3.9     Lipase     Component Value Date/Time   LIPASE 26 07/10/2014 1637     Studies/Results: No results found.  Medications: . antiseptic oral rinse  7 mL Mouth Rinse q12n4p  . chlorhexidine  15 mL Mouth Rinse BID  .  enoxaparin (LOVENOX) injection  40 mg Subcutaneous Q24H  . pantoprazole (PROTONIX) IV  40 mg Intravenous QHS  . piperacillin-tazobactam (ZOSYN)  IV  3.375 g Intravenous 3 times per day    Assessment/Plan 1. LAPAROSCOPIC EXPLORATION AND DRAINAGE OF WHITE PELVIC ABCESS WITH INSERTION OF DRAIN - 07/12/2014 - D. [place] Sloop Right lower quadrant abdominal abscess from ruptured appendicitis 1A. RLQ drain - 65 cc recorded last 24 hours   Some leakage around the drain. 2. DVT prophylaxis - Lovenox Active Problems:   Plan:  She is still very uncomfortable, but much better.  WBC remains very elevated.  Temp curve is better.  I will check on adding antifungal and/or upgrading her to different antibiotic.    LOS: 4 days    [name],[name] 07/14/2014  Agree with above. As much contamination as she had, I'm not surprised she is slow getting better. She wants to try clear liquids. Husband in room and discussed plans with him.  Alphonsa Overall, MD, [facility] Pager: [phone] Office phone:  [phone]

## 2014-07-15 LAB — BASIC METABOLIC PANEL
ANION GAP: 5 (ref 5–15)
BUN: 8 mg/dL (ref 6–23)
CHLORIDE: 108 mmol/L (ref 96–112)
CO2: 24 mmol/L (ref 19–32)
Calcium: 7.9 mg/dL — ABNORMAL LOW (ref 8.4–10.5)
Creatinine, Ser: 0.94 mg/dL (ref 0.50–1.10)
GFR calc non Af Amer: 77 mL/min — ABNORMAL LOW (ref >90)
GFR, EST AFRICAN AMERICAN: 89 mL/min — AB (ref >90)
Glucose, Bld: 119 mg/dL — ABNORMAL HIGH (ref 70–99)
POTASSIUM: 3.6 mmol/L (ref 3.5–5.1)
SODIUM: 137 mmol/L (ref 135–145)

## 2014-07-15 LAB — CBC WITH DIFFERENTIAL/PLATELET
Basophils Absolute: 0 10*3/uL (ref 0.0–0.1)
Basophils Relative: 0 % (ref 0–1)
EOS PCT: 1 % (ref 0–5)
Eosinophils Absolute: 0.2 10*3/uL (ref 0.0–0.7)
HCT: 33.4 % — ABNORMAL LOW (ref 36.0–46.0)
Hemoglobin: 11.2 g/dL — ABNORMAL LOW (ref 12.0–15.0)
LYMPHS PCT: 10 % — AB (ref 12–46)
Lymphs Abs: 1.4 10*3/uL (ref 0.7–4.0)
MCH: 30.4 pg (ref 26.0–34.0)
MCHC: 33.5 g/dL (ref 30.0–36.0)
MCV: 90.8 fL (ref 78.0–100.0)
Monocytes Absolute: 1.5 10*3/uL — ABNORMAL HIGH (ref 0.1–1.0)
Monocytes Relative: 10 % (ref 3–12)
Neutro Abs: 11.1 10*3/uL — ABNORMAL HIGH (ref 1.7–7.7)
Neutrophils Relative %: 79 % — ABNORMAL HIGH (ref 43–77)
PLATELETS: 284 10*3/uL (ref 150–400)
RBC: 3.68 MIL/uL — ABNORMAL LOW (ref 3.87–5.11)
RDW: 13.7 % (ref 11.5–15.5)
WBC: 14.1 10*3/uL — AB (ref 4.0–10.5)

## 2014-07-15 LAB — BODY FLUID CULTURE: Culture: NO GROWTH

## 2014-07-15 NOTE — Progress Notes (Signed)
3 Days Post-Op  Subjective: She had some crampy abdominal pain, requiring narcotics for this.  Tolerating some clears.  Having flatus.  Nothing from the drain except clear fluid.  She says she has more fluid coming around the drain than thru the drain.    Objective: Vital signs in last 24 hours: Temp:  [98.2 F (36.8 C)-99.8 F (37.7 C)] 98.2 F (36.8 C) (02/20 0600) Pulse Rate:  [57-85] 85 (02/20 0600) Resp:  [14-18] 18 (02/20 0600) BP: (101-121)/(56-64) 121/64 mmHg (02/20 0600) SpO2:  [96 %-100 %] 96 % (02/20 0600) Last BM Date: 07/14/14 Intake/Output from previous day: 02/19 0701 - 02/20 0700 In: 350 [P.O.:310] Out: 675 [Urine:575; Drains:100] Intake/Output this shift:    General appearance: alert, cooperative, no distress and just uncomfortable. GI: soft, not distended, just sore.  Her sites look fine.  Drain is clear..  drain site is clean, no erythema and clear.  Lab Results:   Recent Labs  07/14/14 0530 07/15/14 0545  WBC 24.7* 14.1*  HGB 10.6* 11.2*  HCT 31.9* 33.4*  PLT 294 284    BMET  Recent Labs  07/14/14 0530 07/15/14 0545  NA 141 137  K 4.1 3.6  CL 110 108  CO2 23 24  GLUCOSE 132* 119*  BUN 13 8  CREATININE 0.96 0.94  CALCIUM 8.5 7.9*   PT/INR No results for input(s): LABPROT, INR in the last 72 hours.   Recent Labs Lab 07/10/14 1637  AST 18  ALT 19  ALKPHOS 59  BILITOT 0.8  PROT 7.9  ALBUMIN 3.9     Lipase     Component Value Date/Time   LIPASE 26 07/10/2014 1637     Studies/Results: No results found.  Medications: . antiseptic oral rinse  7 mL Mouth Rinse q12n4p  . chlorhexidine  15 mL Mouth Rinse BID  . enoxaparin (LOVENOX) injection  40 mg Subcutaneous Q24H  . micafungin (MYCAMINE) IV  100 mg Intravenous Q24H  . pantoprazole (PROTONIX) IV  40 mg Intravenous QHS  . piperacillin-tazobactam (ZOSYN)  IV  3.375 g Intravenous 3 times per day    Assessment/Plan 1. LAPAROSCOPIC EXPLORATION AND DRAINAGE OF WHITE PELVIC  ABCESS WITH INSERTION OF DRAIN - 07/12/2014 - D. Newman Right lower quadrant abdominal abscess from ruptured appendicitis 1A. RLQ drain - 100 cc recorded last 24 hours   Some leakage around the drain. 2. DVT prophylaxis - Lovenox Active Problems:   Plan:  She is still rather uncomfortable, but better.  WBC coming down.  Temp curve is better.  Cont clears and IVF's.      LOS: 5 days    Katrina Deleon C. 0/17/[id number]

## 2014-07-16 LAB — CLOSTRIDIUM DIFFICILE BY PCR: Toxigenic C. Difficile by PCR: NEGATIVE

## 2014-07-16 NOTE — Progress Notes (Signed)
Patient ID: Katrina Deleon, female   DOB: 1975/10/20, 39 y.o.   MRN: [id number]  [facility]  POD#: 3  Subjective: Patient up in room, family at bedside.  Complains of drainage around drain catheter.  Complains of frequent urination.  On clear liquids - no nausea or emesis.  Objective: Vital signs in last 24 hours: Temp:  [98.5 F (36.9 C)-100.1 F (37.8 C)] 99.4 F (37.4 C) (02/21 0524) Pulse Rate:  [67-82] 68 (02/21 0524) Resp:  [16] 16 (02/21 0524) BP: (110-115)/(51-62) 111/51 mmHg (02/21 0524) SpO2:  [97 %-98 %] 98 % (02/21 0524) Last BM Date: 07/15/14  Intake/Output from previous day: 02/20 0701 - 02/21 0700 In: 8892.5 [P.O.:840; I.V.:7452.5; IV Piggyback:600] Out: 3082 [Urine:1700; Drains:82; BZJIR:6789] Intake/Output this shift:    Physical Exam: HEENT - sclerae clear, mucous membranes moist Neck - soft Chest - clear bilaterally Cor - RRR Abdomen - soft, mild distension; drain RLQ with serous drainage on dressings, serosanguinous output in drain bulb - stripped and dressing replaced Ext - no edema, non-tender Neuro - alert & oriented, no focal deficits  Lab Results:   Recent Labs  07/14/14 0530 07/15/14 0545  WBC 24.7* 14.1*  HGB 10.6* 11.2*  HCT 31.9* 33.4*  PLT 294 284   BMET  Recent Labs  07/14/14 0530 07/15/14 0545  NA 141 137  K 4.1 3.6  CL 110 108  CO2 23 24  GLUCOSE 132* 119*  BUN 13 8  CREATININE 0.96 0.94  CALCIUM 8.5 7.9*   PT/INR No results for input(s): LABPROT, INR in the last 72 hours. Comprehensive Metabolic Panel:    Component Value Date/Time   NA 137 07/15/2014 0545   NA 141 07/14/2014 0530   K 3.6 07/15/2014 0545   K 4.1 07/14/2014 0530   CL 108 07/15/2014 0545   CL 110 07/14/2014 0530   CO2 24 07/15/2014 0545   CO2 23 07/14/2014 0530   BUN 8 07/15/2014 0545   BUN 13 07/14/2014 0530   CREATININE 0.94 07/15/2014 0545   CREATININE 0.96 07/14/2014 0530   CREATININE 0.98  06/09/2012 1017   GLUCOSE 119* 07/15/2014 0545   GLUCOSE 132* 07/14/2014 0530   CALCIUM 7.9* 07/15/2014 0545   CALCIUM 8.5 07/14/2014 0530   AST 18 07/10/2014 1637   AST 17 07/05/2014 1730   ALT 19 07/10/2014 1637   ALT 15 07/05/2014 1730   ALKPHOS 59 07/10/2014 1637   ALKPHOS 41 07/05/2014 1730   BILITOT 0.8 07/10/2014 1637   BILITOT 1.0 07/05/2014 1730   PROT 7.9 07/10/2014 1637   PROT 8.2 07/05/2014 1730   ALBUMIN 3.9 07/10/2014 1637   ALBUMIN 4.5 07/05/2014 1730    Studies/Results: No results found.  Anti-infectives: Anti-infectives    Start     Dose/Rate Route Frequency Ordered Stop   07/14/14 1800  micafungin (MYCAMINE) 100 mg in sodium chloride 0.9 % 100 mL IVPB     100 mg 100 mL/hr over 1 Hours Intravenous Every 24 hours 07/14/14 1651     07/12/14 1345  piperacillin-tazobactam (ZOSYN) IVPB 3.375 g     3.375 g 12.5 mL/hr over 240 Minutes Intravenous 3 times per day 07/12/14 1333     07/10/14 2200  [MAR Hold]  piperacillin-tazobactam (ZOSYN) IVPB 3.375 g  Status:  Discontinued     (MAR Hold since 07/12/14 1000)   3.375 g 12.5 mL/hr over 240 Minutes Intravenous 3 times per day 07/10/14 2020 07/12/14 1333  Assessment & Plans: Perforated appendicitis  Monitor drain output and strip drain every 4 hours - do not irrigate  IV Zosyn  Advance to full liquid diet  CBC in AM 2/22  Decrease IVF rate to 50cc/hr  Earnstine Regal, MD, [facility] Office: [place] 07/16/2014

## 2014-07-17 LAB — CBC WITH DIFFERENTIAL/PLATELET
Basophils Absolute: 0 10*3/uL (ref 0.0–0.1)
Basophils Relative: 0 % (ref 0–1)
Eosinophils Absolute: 0.4 10*3/uL (ref 0.0–0.7)
Eosinophils Relative: 4 % (ref 0–5)
HCT: 30.8 % — ABNORMAL LOW (ref 36.0–46.0)
Hemoglobin: 10.1 g/dL — ABNORMAL LOW (ref 12.0–15.0)
Lymphocytes Relative: 16 % (ref 12–46)
Lymphs Abs: 1.6 10*3/uL (ref 0.7–4.0)
MCH: 29.4 pg (ref 26.0–34.0)
MCHC: 32.8 g/dL (ref 30.0–36.0)
MCV: 89.8 fL (ref 78.0–100.0)
Monocytes Absolute: 0.9 10*3/uL (ref 0.1–1.0)
Monocytes Relative: 9 % (ref 3–12)
NEUTROS ABS: 6.9 10*3/uL (ref 1.7–7.7)
NEUTROS PCT: 71 % (ref 43–77)
Platelets: 284 10*3/uL (ref 150–400)
RBC: 3.43 MIL/uL — ABNORMAL LOW (ref 3.87–5.11)
RDW: 13.6 % (ref 11.5–15.5)
WBC: 9.8 10*3/uL (ref 4.0–10.5)

## 2014-07-17 MED ORDER — SACCHAROMYCES BOULARDII 250 MG PO CAPS
250.0000 mg | ORAL_CAPSULE | Freq: Two times a day (BID) | ORAL | Status: DC
  Administered 2014-07-17 (×2): 250 mg via ORAL
  Filled 2014-07-17 (×4): qty 1

## 2014-07-17 NOTE — Progress Notes (Signed)
5 Days Post-Op  Subjective: Passing gas and bowels moving.  Tolerating full liquids, tortillas and beans.  Objective: Vital signs in last 24 hours: Temp:  [99.2 F (37.3 C)-100 F (37.8 C)] 99.3 F (37.4 C) (02/22 0455) Pulse Rate:  [61-73] 61 (02/22 0455) Resp:  [14-18] 14 (02/22 0455) BP: (99-108)/(52-57) 99/52 mmHg (02/22 0455) SpO2:  [97 %-98 %] 97 % (02/22 0455) Last BM Date: 07/16/14  Intake/Output from previous day: 02/21 0701 - 02/22 0700 In: 1696.7 [I.V.:1546.7; IV Piggyback:150] Out: 2985 [Urine:2950; Drains:35] Intake/Output this shift: Total I/O In: 260 [P.O.:260] Out: -   PE: General- In NAD Abdomen-soft, incisions are clean and intact, serous drain output  Lab Results:   Recent Labs  07/15/14 0545 07/17/14 0513  WBC 14.1* 9.8  HGB 11.2* 10.1*  HCT 33.4* 30.8*  PLT 284 284   BMET  Recent Labs  07/15/14 0545  NA 137  K 3.6  CL 108  CO2 24  GLUCOSE 119*  BUN 8  CREATININE 0.94  CALCIUM 7.9*   PT/INR No results for input(s): LABPROT, INR in the last 72 hours. Comprehensive Metabolic Panel:    Component Value Date/Time   NA 137 07/15/2014 0545   NA 141 07/14/2014 0530   K 3.6 07/15/2014 0545   K 4.1 07/14/2014 0530   CL 108 07/15/2014 0545   CL 110 07/14/2014 0530   CO2 24 07/15/2014 0545   CO2 23 07/14/2014 0530   BUN 8 07/15/2014 0545   BUN 13 07/14/2014 0530   CREATININE 0.94 07/15/2014 0545   CREATININE 0.96 07/14/2014 0530   CREATININE 0.98 06/09/2012 1017   GLUCOSE 119* 07/15/2014 0545   GLUCOSE 132* 07/14/2014 0530   CALCIUM 7.9* 07/15/2014 0545   CALCIUM 8.5 07/14/2014 0530   AST 18 07/10/2014 1637   AST 17 07/05/2014 1730   ALT 19 07/10/2014 1637   ALT 15 07/05/2014 1730   ALKPHOS 59 07/10/2014 1637   ALKPHOS 41 07/05/2014 1730   BILITOT 0.8 07/10/2014 1637   BILITOT 1.0 07/05/2014 1730   PROT 7.9 07/10/2014 1637   PROT 8.2 07/05/2014 1730   ALBUMIN 3.9 07/10/2014 1637   ALBUMIN 4.5 07/05/2014 1730      Studies/Results: No results found.  Anti-infectives: Anti-infectives    Start     Dose/Rate Route Frequency Ordered Stop   07/14/14 1800  micafungin (MYCAMINE) 100 mg in sodium chloride 0.9 % 100 mL IVPB     100 mg 100 mL/hr over 1 Hours Intravenous Every 24 hours 07/14/14 1651     07/12/14 1345  piperacillin-tazobactam (ZOSYN) IVPB 3.375 g     3.375 g 12.5 mL/hr over 240 Minutes Intravenous 3 times per day 07/12/14 1333     07/10/14 2200  [MAR Hold]  piperacillin-tazobactam (ZOSYN) IVPB 3.375 g  Status:  Discontinued     (MAR Hold since 07/12/14 1000)   3.375 g 12.5 mL/hr over 240 Minutes Intravenous 3 times per day 07/10/14 2020 07/12/14 1333      Assessment Principal Problem:   Right lower quadrant abdominal abscess s/p laparoscopic drainage and drain placement-improving on broad spectrum IV abxs, cultures negative to date; bowel function has returned; WBC has normalized.   Ruptured appendicitis (presumed)    LOS: 7 days   Plan: Advance diet.  Heplock IV.  Possibly home tomorrow.   Katrina Deleon Lenna Sciara 07/17/2014

## 2014-07-17 NOTE — Care Management Note (Signed)
    Page 1 of 1   07/17/2014     10:58:52 AM CARE MANAGEMENT NOTE 07/17/2014  Patient:  Katrina Deleon, Katrina Deleon   Account Number:  [id number]  Date Initiated:  07/11/2014  Documentation initiated by:  Sunday Spillers  Subjective/Objective Assessment:   39 yo female admitted with intraabd abscess. PTA lived at home with spouse.     Action/Plan:   Home when stable   Anticipated DC Date:  07/17/2014   Anticipated DC Plan:  [place]  CM consult      Choice offered to / List presented to:             Status of service:  Completed, signed off Medicare Important Message given?   (If response is "NO", the following Medicare IM given date fields will be blank) Date Medicare IM given:   Medicare IM given by:   Date Additional Medicare IM given:   Additional Medicare IM given by:    Discharge Disposition:  HOME/SELF CARE  Per UR Regulation:  Reviewed for med. necessity/level of care/duration of stay  If discussed at [place] of Stay Meetings, dates discussed:    Comments:

## 2014-07-18 MED ORDER — HYDROCODONE-ACETAMINOPHEN 5-325 MG PO TABS: 1.0000 | ORAL_TABLET | ORAL | Status: DC | PRN

## 2014-07-18 MED ORDER — METRONIDAZOLE 500 MG PO TABS: 500.0000 mg | ORAL_TABLET | Freq: Three times a day (TID) | ORAL | Status: AC

## 2014-07-18 MED ORDER — AMOXICILLIN-POT CLAVULANATE 875-125 MG PO TABS: 1.0000 | ORAL_TABLET | Freq: Two times a day (BID) | ORAL | Status: DC

## 2014-07-18 NOTE — Progress Notes (Signed)
6 Days Post-Op  Subjective: Tolerating solid diet.  Bowels moving.  Objective: Vital signs in last 24 hours: Temp:  [98.2 F (36.8 C)-99.3 F (37.4 C)] 98.4 F (36.9 C) (02/23 0620) Pulse Rate:  [60-103] 60 (02/23 0620) Resp:  [16-18] 16 (02/23 0620) BP: (105-111)/(53-67) 105/56 mmHg (02/23 0620) SpO2:  [98 %-99 %] 99 % (02/23 0620) Last BM Date: 07/18/14  Intake/Output from previous day: 02/22 0701 - 02/23 0700 In: 1040 [P.O.:740; IV Piggyback:300] Out: 3154 [Urine:4050; Drains:95; Stool:500] Intake/Output this shift:    PE: General- In NAD Abdomen-soft, incisions are clean and intact, serous drain output  Lab Results:   Recent Labs  07/17/14 0513  WBC 9.8  HGB 10.1*  HCT 30.8*  PLT 284   BMET No results for input(s): NA, K, CL, CO2, GLUCOSE, BUN, CREATININE, CALCIUM in the last 72 hours. PT/INR No results for input(s): LABPROT, INR in the last 72 hours. Comprehensive Metabolic Panel:    Component Value Date/Time   NA 137 07/15/2014 0545   NA 141 07/14/2014 0530   K 3.6 07/15/2014 0545   K 4.1 07/14/2014 0530   CL 108 07/15/2014 0545   CL 110 07/14/2014 0530   CO2 24 07/15/2014 0545   CO2 23 07/14/2014 0530   BUN 8 07/15/2014 0545   BUN 13 07/14/2014 0530   CREATININE 0.94 07/15/2014 0545   CREATININE 0.96 07/14/2014 0530   CREATININE 0.98 06/09/2012 1017   GLUCOSE 119* 07/15/2014 0545   GLUCOSE 132* 07/14/2014 0530   CALCIUM 7.9* 07/15/2014 0545   CALCIUM 8.5 07/14/2014 0530   AST 18 07/10/2014 1637   AST 17 07/05/2014 1730   ALT 19 07/10/2014 1637   ALT 15 07/05/2014 1730   ALKPHOS 59 07/10/2014 1637   ALKPHOS 41 07/05/2014 1730   BILITOT 0.8 07/10/2014 1637   BILITOT 1.0 07/05/2014 1730   PROT 7.9 07/10/2014 1637   PROT 8.2 07/05/2014 1730   ALBUMIN 3.9 07/10/2014 1637   ALBUMIN 4.5 07/05/2014 1730     Studies/Results: No results found.  Anti-infectives: Anti-infectives    Start     Dose/Rate Route Frequency Ordered Stop   07/14/14  1800  micafungin (MYCAMINE) 100 mg in sodium chloride 0.9 % 100 mL IVPB     100 mg 100 mL/hr over 1 Hours Intravenous Every 24 hours 07/14/14 1651     07/12/14 1345  piperacillin-tazobactam (ZOSYN) IVPB 3.375 g     3.375 g 12.5 mL/hr over 240 Minutes Intravenous 3 times per day 07/12/14 1333     07/10/14 2200  [MAR Hold]  piperacillin-tazobactam (ZOSYN) IVPB 3.375 g  Status:  Discontinued     (MAR Hold since 07/12/14 1000)   3.375 g 12.5 mL/hr over 240 Minutes Intravenous 3 times per day 07/10/14 2020 07/12/14 1333      Assessment Principal Problem:   Right lower quadrant abdominal abscess s/p laparoscopic drainage and drain placement 07/12/14 (Dr. Diona Foley well now.   Ruptured appendicitis (presumed)    LOS: 8 days   Plan: Remove drain-done.  Discharge on oral abxs for one more week.  Follow up with Dr. Lucia Gaskins in 1-2 weeks.  Discharge instructions explained to her and her husband.   Katrina Deleon 07/18/2014

## 2014-07-18 NOTE — Progress Notes (Signed)
Discharge instructions given along with prescriptions.  Questions answered

## 2014-07-18 NOTE — Discharge Instructions (Signed)
[facility] ______[facility] LAPAROSCOPIC SURGERY: POST OP INSTRUCTIONS Always review your discharge instruction sheet given to you by the facility where your surgery was performed. IF YOU HAVE DISABILITY OR FAMILY LEAVE FORMS, YOU MUST BRING THEM TO THE OFFICE FOR PROCESSING.   DO NOT GIVE THEM TO YOUR DOCTOR.  1. A prescription for pain medication may be given to you upon discharge.  Take your pain medication as prescribed, if needed.  If narcotic pain medicine is not needed, then you may take acetaminophen (Tylenol) or ibuprofen (Advil) as needed. 2. Take your usually prescribed medications unless otherwise directed. 3. If you need a refill on your pain medication, please contact your pharmacy.  They will contact our office to request authorization. Prescriptions will not be filled after 5pm or on week-ends. 4. You should follow a high protein, lowfat dieIt.  It is common to experience some constipation if taking pain medication after surgery.  Increasing fluid intake and taking a stool softener (such as Colace) will usually help or prevent this problem from occurring.  A mild laxative (Milk of Magnesia or Miralax) should be taken according to package instructions if there are no bowel movements after 48 hours. 5. Unless discharge instructions indicate otherwise, you may remove your bandages 24-48 hours after surgery, and you may shower at that time.  You may have steri-strips (small skin tapes) in place directly over the incision.  These strips should be left on the skin for 7-10 days.  If your surgeon used skin glue on the incision, you may shower in 24 hours.  The glue will flake off over the next 2-3 weeks.  Any sutures or staples will be removed at the office during your follow-up visit. 6. ACTIVITIES:  You may resume regular (light) daily activities beginning the next day--such as daily self-care, walking, climbing stairs--gradually increasing activities as tolerated.  You may have  sexual intercourse when it is comfortable.  Refrain from any heavy lifting or straining for 2 weeks. a. You may drive when you are no longer taking prescription pain medication, you can comfortably wear a seatbelt, and you can safely maneuver your car and apply brakes. b. RETURN TO WORK:  _In 1-2 weeks when you feel strong enough_________________________________________________________ 7. You should see Dr. Lucia Gaskins in the office for a follow-up appointment approximately 1-2 weeks after your surgery.  Make sure that you call for this appointment within a day or two after you arrive home to insure a convenient appointment time. 8. OTHER INSTRUCTIONS: ______Take a probiotic daily and eat a cup of yogurt daily as long as you are taking antibiotics.____________________________________________________________________________________________________________________ __________________________________________________________________________________________________________________________ WHEN TO CALL YOUR DOCTOR: 1. Fever over 101.0 2. Inability to urinate 3. Continued bleeding from incision. 4. Increased pain, redness, or drainage from the incision. 5. Increasing abdominal pain  The clinic staff is available to answer your questions during regular business hours.  Please dont hesitate to call and ask to speak to one of the nurses for clinical concerns.  If you have a medical emergency, go to the nearest emergency room or call 911.  A surgeon from [facility] is always on call at the hospital. [address] [place], [place], [place]  [address] ? [address], [place], [place]   [address] [phone] ? [phone] ? FAX [phone] Web site: [email or web address]

## 2014-07-27 ENCOUNTER — Other Ambulatory Visit (INDEPENDENT_AMBULATORY_CARE_PROVIDER_SITE_OTHER): Payer: Self-pay | Admitting: Surgery

## 2014-07-27 DIAGNOSIS — R109 Unspecified abdominal pain: Secondary | ICD-10-CM

## 2014-07-27 DIAGNOSIS — K3532 Acute appendicitis with perforation and localized peritonitis, without abscess: Secondary | ICD-10-CM

## 2014-07-27 DIAGNOSIS — R1031 Right lower quadrant pain: Secondary | ICD-10-CM

## 2014-07-28 ENCOUNTER — Ambulatory Visit
Admission: RE | Admit: 2014-07-28 | Discharge: 2014-07-28 | Disposition: A | Discharge status: Home / Self Care | Payer: Managed Care, Other (non HMO) | Source: Ambulatory Visit | Attending: Surgery | Admitting: Surgery

## 2014-07-28 MED ORDER — IOPAMIDOL (ISOVUE-300) INJECTION 61%
100.0000 mL | Freq: Once | INTRAVENOUS | Status: AC | PRN
  Administered 2014-07-28: 100 mL via INTRAVENOUS

## 2014-07-30 NOTE — Discharge Summary (Signed)
Physician Discharge Summary  Patient ID: Katrina Deleon MRN: [id number] DOB/AGE: 08-04-1975 39 y.o.  Admit date: 07/10/2014 Discharge date: 07/18/2014  Admission Diagnoses:  Right lower quadrant inflammatory/infectious process with abscess . Fibroadenoma of breast 2003  . Ulcer   . Cancer         Discharge Diagnoses:  Ruptured appendicitis. Principal Problem:   Right lower quadrant abdominal abscess Active Problems:   Ruptured appendicitis   PROCEDURES: Laparoscopic exploration with evacuation of right pelvic abscess and irrigation of the abdominal cavity. 07/13/14, Dr. Elby Beck Course: this is a 39 year old female who began having some diffuse abdominal discomfort one week ago. The pain then radiated to the right lower quadrant. She presented to an outside facility on 07/06/14 ( [facility] [place]). A CT scan was performed which demonstrated an inflammatory type process. There was a concern this could be a tubo-ovarian infection. The appendix was not visualized. Pelvic ultrasound did not demonstrate an abnormal appearing ovary. It was recommended that she be admitted to the hospital but she was feeling better after her evaluation and declined admission. Over the past 4 days, she has had persistent pain with fever and chills. She presented to the [facility] emergency department and had a white blood cell count of 18,500. CT scan demonstrated a right lower quadrant inflammatory process with a 4.1 cm fluid collection consistent with an abscess. Etiology remains unclear. We were asked to see her because of this. She denies dysuria or hematuria. She has some nausea but no vomiting. No anorexia. She is currently menstruating. She was admitted and placed on IV antibiotics, she was very tender.  We watched her for 24 hours and her WBC seemed to be improving.  The following AM hospital day 2, we knew her abscess was not amenable to IR drain, WBC ws worse and she was  taken to the OR for procedure noted above.  She was maintained on IV antibiotics and made slow improvement.  Her diet was advanced and by her 6th post op day she was ready for discharge home.  Pt was doing well and will follow up with Dr. Lucia Gaskins as noted below.  Antibiotics as noted below also for 7 more days.   Condition on d/c:  Improved      Disposition: 01-Home or Self Care     Medication List    STOP taking these medications        acetaminophen 500 MG tablet  Commonly known as:  TYLENOL      TAKE these medications        amoxicillin-clavulanate 875-125 MG per tablet  Commonly known as:  AUGMENTIN  Take 1 tablet by mouth 2 (two) times daily.     Beclomethasone Dipropionate 80 MCG/ACT Aers  Place 2 sprays into the nose daily.     CALCIUM 600 PO  Take by mouth.     fish oil-omega-3 fatty acids 1000 MG capsule  Take 2 g by mouth daily.     HYDROcodone-acetaminophen 5-325 MG per tablet  Commonly known as:  NORCO/VICODIN  Take 1-2 tablets by mouth every 4 (four) hours as needed for moderate pain.     ibuprofen 200 MG tablet  Commonly known as:  ADVIL,MOTRIN  Take 800 mg by mouth every 4 (four) hours as needed for headache or moderate pain.     metroNIDAZOLE 500 MG tablet  Commonly known as:  FLAGYL  Take 1 tablet (500 mg total) by mouth 3 (three) times daily.  PRESCRIPTION MEDICATION  Inject 1 each into the skin once.         SignedEarnstine Regal 07/30/2014, 11:29 AM  Agree with above.  Alphonsa Overall, MD, [facility] Pager: [phone] Office phone:  [phone]

## 2014-08-01 ENCOUNTER — Other Ambulatory Visit: Payer: Managed Care, Other (non HMO)

## 2014-11-27 ENCOUNTER — Encounter (HOSPITAL_COMMUNITY): Payer: Self-pay | Admitting: Emergency Medicine

## 2014-11-27 ENCOUNTER — Emergency Department (HOSPITAL_COMMUNITY): Payer: Managed Care, Other (non HMO)

## 2014-11-27 ENCOUNTER — Inpatient Hospital Stay (HOSPITAL_COMMUNITY)
Admission: EM | Admit: 2014-11-27 | Discharge: 2014-12-03 | DRG: 339 | Disposition: A | Discharge status: Home Health | Payer: Managed Care, Other (non HMO) | Attending: General Surgery | Admitting: General Surgery

## 2014-11-27 DIAGNOSIS — R188 Other ascites: Secondary | ICD-10-CM | POA: Diagnosis present

## 2014-11-27 DIAGNOSIS — D62 Acute posthemorrhagic anemia: Secondary | ICD-10-CM | POA: Diagnosis not present

## 2014-11-27 DIAGNOSIS — R1031 Right lower quadrant pain: Secondary | ICD-10-CM | POA: Diagnosis not present

## 2014-11-27 DIAGNOSIS — N898 Other specified noninflammatory disorders of vagina: Secondary | ICD-10-CM | POA: Diagnosis present

## 2014-11-27 DIAGNOSIS — K352 Acute appendicitis with generalized peritonitis: Secondary | ICD-10-CM | POA: Diagnosis not present

## 2014-11-27 DIAGNOSIS — R630 Anorexia: Secondary | ICD-10-CM | POA: Diagnosis present

## 2014-11-27 DIAGNOSIS — K3532 Acute appendicitis with perforation and localized peritonitis, without abscess: Secondary | ICD-10-CM | POA: Diagnosis present

## 2014-11-27 DIAGNOSIS — Z79899 Other long term (current) drug therapy: Secondary | ICD-10-CM

## 2014-11-27 DIAGNOSIS — R109 Unspecified abdominal pain: Secondary | ICD-10-CM | POA: Diagnosis present

## 2014-11-27 DIAGNOSIS — R197 Diarrhea, unspecified: Secondary | ICD-10-CM | POA: Diagnosis present

## 2014-11-27 DIAGNOSIS — N7011 Chronic salpingitis: Secondary | ICD-10-CM | POA: Diagnosis present

## 2014-11-27 LAB — CBC WITH DIFFERENTIAL/PLATELET
BASOS ABS: 0 10*3/uL (ref 0.0–0.1)
BASOS PCT: 0 % (ref 0–1)
EOS ABS: 0.4 10*3/uL (ref 0.0–0.7)
EOS PCT: 5 % (ref 0–5)
HEMATOCRIT: 44.8 % (ref 36.0–46.0)
Hemoglobin: 15.2 g/dL — ABNORMAL HIGH (ref 12.0–15.0)
LYMPHS ABS: 1.3 10*3/uL (ref 0.7–4.0)
LYMPHS PCT: 15 % (ref 12–46)
MCH: 29.9 pg (ref 26.0–34.0)
MCHC: 33.9 g/dL (ref 30.0–36.0)
MCV: 88 fL (ref 78.0–100.0)
Monocytes Absolute: 0.2 10*3/uL (ref 0.1–1.0)
Monocytes Relative: 3 % (ref 3–12)
NEUTROS ABS: 6.7 10*3/uL (ref 1.7–7.7)
NEUTROS PCT: 77 % (ref 43–77)
PLATELETS: 169 10*3/uL (ref 150–400)
RBC: 5.09 MIL/uL (ref 3.87–5.11)
RDW: 14.4 % (ref 11.5–15.5)
WBC: 8.7 10*3/uL (ref 4.0–10.5)

## 2014-11-27 LAB — COMPREHENSIVE METABOLIC PANEL
ALBUMIN: 3.9 g/dL (ref 3.5–5.0)
ALT: 16 U/L (ref 14–54)
AST: 17 U/L (ref 15–41)
Alkaline Phosphatase: 41 U/L (ref 38–126)
Anion gap: 10 (ref 5–15)
BILIRUBIN TOTAL: 1 mg/dL (ref 0.3–1.2)
BUN: 12 mg/dL (ref 6–20)
CHLORIDE: 106 mmol/L (ref 101–111)
CO2: 23 mmol/L (ref 22–32)
CREATININE: 0.95 mg/dL (ref 0.44–1.00)
Calcium: 9.1 mg/dL (ref 8.9–10.3)
GFR calc Af Amer: >60 mL/min (ref >60)
GFR calc non Af Amer: >60 mL/min (ref >60)
Glucose, Bld: 105 mg/dL — ABNORMAL HIGH (ref 65–99)
Potassium: 3.5 mmol/L (ref 3.5–5.1)
Sodium: 139 mmol/L (ref 135–145)
Total Protein: 6.8 g/dL (ref 6.5–8.1)

## 2014-11-27 LAB — WET PREP, GENITAL
Clue Cells Wet Prep HPF POC: NONE SEEN
Trich, Wet Prep: NONE SEEN
Yeast Wet Prep HPF POC: NONE SEEN

## 2014-11-27 LAB — URINALYSIS, ROUTINE W REFLEX MICROSCOPIC
Bilirubin Urine: NEGATIVE
Glucose, UA: NEGATIVE mg/dL
Hgb urine dipstick: NEGATIVE
Ketones, ur: 15 mg/dL — AB
Leukocytes, UA: NEGATIVE
Nitrite: NEGATIVE
PH: 5.5 (ref 5.0–8.0)
PROTEIN: NEGATIVE mg/dL
Specific Gravity, Urine: 1.025 (ref 1.005–1.030)
Urobilinogen, UA: 0.2 mg/dL (ref 0.0–1.0)

## 2014-11-27 LAB — LIPASE, BLOOD: Lipase: 22 U/L (ref 22–51)

## 2014-11-27 MED ORDER — IOHEXOL 300 MG/ML  SOLN
50.0000 mL | Freq: Once | INTRAMUSCULAR | Status: AC | PRN
  Administered 2014-11-27: 50 mL via ORAL

## 2014-11-27 MED ORDER — PANTOPRAZOLE SODIUM 40 MG IV SOLR
40.0000 mg | Freq: Every day | INTRAVENOUS | Status: DC
  Administered 2014-11-27 – 2014-11-30 (×4): 40 mg via INTRAVENOUS
  Filled 2014-11-27 (×5): qty 40

## 2014-11-27 MED ORDER — SODIUM CHLORIDE 0.9 % IV BOLUS (SEPSIS)
1000.0000 mL | Freq: Once | INTRAVENOUS | Status: AC
  Administered 2014-11-27: 1000 mL via INTRAVENOUS

## 2014-11-27 MED ORDER — LABETALOL HCL 5 MG/ML IV SOLN: 10.0000 mg | Freq: Once | INTRAVENOUS | Status: DC

## 2014-11-27 MED ORDER — KCL IN DEXTROSE-NACL 20-5-0.45 MEQ/L-%-% IV SOLN
INTRAVENOUS | Status: DC
Start: 2014-11-27 — End: 2014-11-28
  Administered 2014-11-27 – 2014-11-28 (×2): via INTRAVENOUS
  Filled 2014-11-27 (×3): qty 1000

## 2014-11-27 MED ORDER — HYDROMORPHONE HCL 1 MG/ML IJ SOLN
INTRAMUSCULAR | Status: AC
  Administered 2014-11-27: 1 mg
  Filled 2014-11-27: qty 1

## 2014-11-27 MED ORDER — HYDROMORPHONE HCL 1 MG/ML IJ SOLN
0.5000 mg | INTRAMUSCULAR | Status: DC | PRN
  Administered 2014-11-27 – 2014-11-28 (×3): 0.5 mg via INTRAVENOUS
  Filled 2014-11-27 (×3): qty 1

## 2014-11-27 MED ORDER — HYDROMORPHONE HCL 1 MG/ML IJ SOLN
1.0000 mg | Freq: Once | INTRAMUSCULAR | Status: AC
  Administered 2014-11-27: 1 mg via INTRAVENOUS
  Filled 2014-11-27: qty 1

## 2014-11-27 MED ORDER — ONDANSETRON HCL 4 MG/2ML IJ SOLN: 4.0000 mg | Freq: Four times a day (QID) | INTRAMUSCULAR | Status: DC | PRN

## 2014-11-27 MED ORDER — PIPERACILLIN-TAZOBACTAM 3.375 G IVPB
3.3750 g | Freq: Three times a day (TID) | INTRAVENOUS | Status: DC
  Administered 2014-11-27 – 2014-12-02 (×15): 3.375 g via INTRAVENOUS
  Filled 2014-11-27 (×17): qty 50

## 2014-11-27 MED ORDER — DIPHENHYDRAMINE HCL 12.5 MG/5ML PO ELIX: 12.5000 mg | ORAL_SOLUTION | Freq: Four times a day (QID) | ORAL | Status: DC | PRN

## 2014-11-27 MED ORDER — HEPARIN SODIUM (PORCINE) 5000 UNIT/ML IJ SOLN
5000.0000 [IU] | Freq: Three times a day (TID) | INTRAMUSCULAR | Status: DC
  Administered 2014-11-27 – 2014-11-29 (×5): 5000 [IU] via SUBCUTANEOUS
  Filled 2014-11-27 (×12): qty 1

## 2014-11-27 MED ORDER — IOHEXOL 300 MG/ML  SOLN
100.0000 mL | Freq: Once | INTRAMUSCULAR | Status: AC | PRN
  Administered 2014-11-27: 100 mL via INTRAVENOUS

## 2014-11-27 MED ORDER — DIPHENHYDRAMINE HCL 50 MG/ML IJ SOLN: 12.5000 mg | Freq: Four times a day (QID) | INTRAMUSCULAR | Status: DC | PRN

## 2014-11-27 MED ORDER — FUROSEMIDE 10 MG/ML IJ SOLN: 40.0000 mg | Freq: Once | INTRAMUSCULAR | Status: DC

## 2014-11-27 NOTE — ED Notes (Signed)
Pt transported to CT at present time. 

## 2014-11-27 NOTE — ED Provider Notes (Signed)
CSN: [id number]     Arrival date & time 11/27/14  0542 History   First MD Initiated Contact with Patient 11/27/14 [phone]     Chief Complaint  Patient presents with  . Abdominal Pain     (Consider location/radiation/quality/duration/timing/severity/associated sxs/prior Treatment) HPI Katrina Deleon is a 39 y.o. female the history of ruptured appendicitis with abscess on 2/17, comes in for evaluation of right lower quadrant pain. Patient reports this pain is the same pain that she experienced when she had her abdominal abscess. Onset was yesterday morning and has rapidly worsened. She rates her pain as 9/10. She denies any associated fever, nausea or vomiting, chills, urinary symptoms. She reports loose stool last night, but no bowel movements today. Hard foods worsen her symptoms. Last menstrual period was 2 weeks ago and normal for her. No other aggravating or modifying factors. Last ate last night, a few lentils.  Patient has no personal history of cancer.  Past Medical History  Diagnosis Date  . Fibroadenoma of breast 2003  . Ulcer   . Cancer    Past Surgical History  Procedure Laterality Date  . Breast excisional biopsy  2003    benign  . Laparoscopic appendectomy N/A 07/12/2014    Procedure: LAPAROSCOPIC EXPLORATION AND DRAINAGE OF WHITE PELVIC ABCESS WITH  INSERTION OF DRAIN;  Surgeon: Alphonsa Overall, MD;  Location: [facility];  Service: General;  Laterality: N/A;   Family History  Problem Relation Age of Onset  . Breast cancer Mother   . Breast cancer Paternal Grandmother   . Uterine cancer Maternal Grandmother    History  Substance Use Topics  . Smoking status: Never Smoker   . Smokeless tobacco: Never Used  . Alcohol Use: 0.5 oz/week    1 Standard drinks or equivalent per week     Comment: socially    OB History    Gravida Para Term Preterm AB TAB SAB Ectopic Multiple Living   0 0 0 0 0 0 0 0 0 0      Review of Systems A 10 point review of systems was completed and  was negative except for pertinent positives and negatives as mentioned in the history of present illness     Allergies  Other  Home Medications   Prior to Admission medications   Medication Sig Start Date End Date Taking? Authorizing Provider  aspirin 325 MG tablet Take 650 mg by mouth every 4 (four) hours as needed.   Yes Historical Provider, MD  Fish Oil OIL Take 5 mLs by mouth daily.   Yes Historical Provider, MD  ibuprofen (ADVIL,MOTRIN) 200 MG tablet Take 400 mg by mouth every 4 (four) hours as needed for headache or moderate pain.    Yes Historical Provider, MD  Multiple Minerals-Vitamins (CALCIUM & VIT D3 BONE HEALTH) LIQD Take 5 mLs by mouth daily.   Yes Historical Provider, MD  amoxicillin-clavulanate (AUGMENTIN) 875-125 MG per tablet Take 1 tablet by mouth 2 (two) times daily. Patient not taking: Reported on 11/27/2014 07/18/14   Jackolyn Confer, MD  Beclomethasone Dipropionate 80 MCG/ACT AERS Place 2 sprays into the nose daily. Patient not taking: Reported on 11/27/2014 09/27/12   Marcial Pacas, DO  HYDROcodone-acetaminophen (NORCO/VICODIN) 5-325 MG per tablet Take 1-2 tablets by mouth every 4 (four) hours as needed for moderate pain. Patient not taking: Reported on 11/27/2014 07/18/14   Jackolyn Confer, MD   BP 102/47 mmHg  Pulse 77  Temp(Src) 98.7 F (37.1 C) (Oral)  Resp 16  Ht 5'  10" (1.778 m)  Wt 138 lb (62.596 kg)  BMI 19.80 kg/m2  SpO2 98%  LMP 11/13/2014 Physical Exam  Constitutional: She is oriented to person, place, and time. She appears well-developed and well-nourished.  HENT:  Head: Normocephalic and atraumatic.  Mouth/Throat: Oropharynx is clear and moist.  Eyes: Conjunctivae are normal. Pupils are equal, round, and reactive to light. Right eye exhibits no discharge. Left eye exhibits no discharge. No scleral icterus.  Neck: Neck supple.  Cardiovascular: Normal rate, regular rhythm and normal heart sounds.   Pulmonary/Chest: Effort normal and breath sounds  normal. No respiratory distress. She has no wheezes. She has no rales.  Abdominal: Soft.  Exquisite tenderness to palpation in right lower quadrant. Some guarding noted. Abdomen is otherwise soft, nondistended. Previous surgical incisions healing well. No other lesions or deformities noted.  Genitourinary:  Chaperone was present for the entire genital exam. No lesions or rashes appreciated on vulva. Cervix visualized on speculum exam and appropriate cultures sampled. Cervix appears mildly friable at the eyes. Scant blood in vaginal vault. Discharge: Whitish yellow Upon bi manual exam- mild TTP of the right adnexa, no cervical motion tenderness. No fullness or masses appreciated. No abnormalities appreciated in structural anatomy.   Musculoskeletal: She exhibits no tenderness.  Neurological: She is alert and oriented to person, place, and time.  Cranial Nerves II-XII grossly intact  Skin: Skin is warm and dry. No rash noted.  Psychiatric: She has a normal mood and affect.  Nursing note and vitals reviewed.   ED Course  Procedures (including critical care time) Labs Review Labs Reviewed  WET PREP, GENITAL - Abnormal; Notable for the following:    WBC, Wet Prep HPF POC FEW (*)    All other components within normal limits  CBC WITH DIFFERENTIAL/PLATELET - Abnormal; Notable for the following:    Hemoglobin 15.2 (*)    All other components within normal limits  COMPREHENSIVE METABOLIC PANEL - Abnormal; Notable for the following:    Glucose, Bld 105 (*)    All other components within normal limits  URINALYSIS, ROUTINE W REFLEX MICROSCOPIC (NOT AT [facility]) - Abnormal; Notable for the following:    Color, Urine AMBER (*)    APPearance CLOUDY (*)    Ketones, ur 15 (*)    All other components within normal limits  LIPASE, BLOOD  HIV ANTIBODY (ROUTINE TESTING)  GC/CHLAMYDIA PROBE AMP ([place]) NOT AT [facility]    Imaging Review Ct Abdomen Pelvis W Contrast  11/27/2014   CLINICAL DATA:   Right lower quadrant pain.  EXAM: CT ABDOMEN AND PELVIS WITH CONTRAST  TECHNIQUE: Multidetector CT imaging of the abdomen and pelvis was performed using the standard protocol following bolus administration of intravenous contrast.  CONTRAST:  112mL OMNIPAQUE IOHEXOL 300 MG/ML  SOLN  COMPARISON:  07/28/2014  FINDINGS: Lung bases are clear.  No effusions.  Heart is normal size.  Liver, spleen, pancreas, gallbladder, adrenals and kidneys are unremarkable.  There is moderate free fluid around the liver and spleen as well as in the paracolic gutters and pelvis. Appendix is visualized with a diameter of 9 mm and suggestion of mucosal enhancement. Small amount a gas is noted near the tip of the appendix.  Uterus is unremarkable. Cystic areas noted in the right adnexa could reflect small ovarian cysts or hydrosalpinx.  Small bowel loops are mildly prominent throughout the abdomen and pelvis. Several loops of small bowel in the pelvis appears thick walled. This could reflect enteritis or secondary inflammation. Large bowel  unremarkable. Aorta is normal caliber. No free air or adenopathy.  IMPRESSION: Appendix is mildly prominent, measuring 9 mm in diameter with mucosal enhancement. There is moderate free fluid in the abdomen and pelvis. Given the history of ruptured appendicitis, I cannot exclude recurrent ruptured appendicitis. Overall appearance similar to prior study although the free fluid is larger on today's study.  Areas of small bowel wall thickening in the pelvis could be related to primary enteritis or secondary inflammation.  Small cystic areas within the right adnexa could reflect ovarian cysts or hydrosalpinx.   Electronically Signed   By: Rolm Baptise M.D.   On: 11/27/2014 09:06     EKG Interpretation None     Meds given in ED:  Medications  HYDROmorphone (DILAUDID) injection 1 mg (1 mg Intravenous Given 11/27/14 0627)  sodium chloride 0.9 % bolus 1,000 mL (0 mLs Intravenous Stopped 11/27/14 0741)   iohexol (OMNIPAQUE) 300 MG/ML solution 50 mL (50 mLs Oral Contrast Given 11/27/14 0626)  iohexol (OMNIPAQUE) 300 MG/ML solution 100 mL (100 mLs Intravenous Contrast Given 11/27/14 0829)  sodium chloride 0.9 % bolus 1,000 mL (0 mLs Intravenous Stopped 11/27/14 1031)    New Prescriptions   No medications on file   Filed Vitals:   11/27/14 0953 11/27/14 1000 11/27/14 1052 11/27/14 1121  BP: 96/41 100/48 101/52 102/47  Pulse: 79  82 77  Temp:   99.6 F (37.6 C) 98.7 F (37.1 C)  TempSrc:   Oral Oral  Resp: 16  16 16   Height:      Weight:      SpO2: 99%  99% 98%    MDM  Patient here with right lower quadrant abdominal pain. Diagnosis of ruptured appendicitis and laparoscopy on 2/17. Patient presents today with same pain she had with previous diagnosis. Vital signs remained stable, patient is afebrile. Patient is exquisitely tender in right lower quadrant. Pelvic exam with mild cervical friability as well as mild right adnexal tenderness, yellowish-white discharge, potentially not related to current pain. CT abdomen obtained shows mildly prominent appendix at 9 mm without stranding. Several small right ovarian cysts Discussed patient presentation with general surgery, Dr. Hassell Done will come to the ED and evaluate patient. Patient admitted for IV antibiotic's and further evaluation and management of right abdominal pain. Prior to patient admission, I discussed and reviewed this case with my attending, Dr. Randal Buba, who also saw and evaluated the patient.   Final diagnoses:  Right lower quadrant abdominal pain       Comer Locket, PA-C 11/27/14 1241  April Palumbo, MD 11/27/14 2347

## 2014-11-27 NOTE — Progress Notes (Signed)
Paged MD on call to notify of pt's high temp and low BP.  During the 2100 hour pt's temp was 100.6 and BP was 93/44. No new orders received.Will continue to monitor pt.

## 2014-11-27 NOTE — H&P (Signed)
Chief Complaint:  Recurrent abdominal pain and ascites; history of ruptured appendix  History of Present Illness:  Katrina Deleon is an 39 y.o. female who was admitted in Feb with an abscess treated by Dr. Lucia Gaskins with laparoscopic drainage and no appendix could be found.  He followed her in the office and was not convinced that any appendix remained.    Today's CT shows fluid around her liver and spleen and there is a structure that is called appendix that has air in it but no stranding.  Her pain has started in the upper abdomen and includes anorexia and has moved down around her umbilicus and to the right lower quadrant.    I discussed management with her and her husband.  I think that admission with antibiotics with observation and possibly bowel prep before interval appendectomy is undertaken which may result in cecectomy.    Past Medical History  Diagnosis Date  . Fibroadenoma of breast 2003  . Ulcer   . Cancer     Past Surgical History  Procedure Laterality Date  . Breast excisional biopsy  2003    benign  . Laparoscopic appendectomy N/A 07/12/2014    Procedure: LAPAROSCOPIC EXPLORATION AND DRAINAGE OF WHITE PELVIC ABCESS WITH  INSERTION OF DRAIN;  Surgeon: Alphonsa Overall, MD;  Location: [facility];  Service: General;  Laterality: N/A;    No current facility-administered medications for this encounter.   Current Outpatient Prescriptions  Medication Sig Dispense Refill  . aspirin 325 MG tablet Take 650 mg by mouth every 4 (four) hours as needed.    . Fish Oil OIL Take 5 mLs by mouth daily.    [name] ibuprofen (ADVIL,MOTRIN) 200 MG tablet Take 400 mg by mouth every 4 (four) hours as needed for headache or moderate pain.     . Multiple Minerals-Vitamins (CALCIUM & VIT D3 BONE HEALTH) LIQD Take 5 mLs by mouth daily.    [name] amoxicillin-clavulanate (AUGMENTIN) 875-125 MG per tablet Take 1 tablet by mouth 2 (two) times daily. (Patient not taking: Reported on 11/27/2014) 14 tablet 0  .  Beclomethasone Dipropionate 80 MCG/ACT AERS Place 2 sprays into the nose daily. (Patient not taking: Reported on 11/27/2014) 8.7 g 0  . HYDROcodone-acetaminophen (NORCO/VICODIN) 5-325 MG per tablet Take 1-2 tablets by mouth every 4 (four) hours as needed for moderate pain. (Patient not taking: Reported on 11/27/2014) 30 tablet 0   Other Family History  Problem Relation Age of Onset  . Breast cancer Mother   . Breast cancer Paternal Grandmother   . Uterine cancer Maternal Grandmother    Social History:   reports that she has never smoked. She has never used smokeless tobacco. She reports that she drinks about 0.5 oz of alcohol per week. She reports that she does not use illicit drugs.   REVIEW OF SYSTEMS : Negative except for see problem list  Physical Exam:   Blood pressure 102/47, pulse 77, temperature 98.7 F (37.1 C), temperature source Oral, resp. rate 16, height '5\' 10"'  (1.778 m), weight 62.596 kg (138 lb), last menstrual period 11/13/2014, SpO2 98 %. Body mass index is 19.8 kg/(m^2).  Gen:  WDthin WF NAD  Neurological: Alert and oriented to person, place, and time. Motor and sensory function is grossly intact  Head: Normocephalic and atraumatic.  Eyes: Conjunctivae are normal. Pupils are equal, round, and reactive to light. No scleral icterus.  Neck: Normal range of motion. Neck supple. No tracheal deviation or thyromegaly present.  Cardiovascular:  SR without murmurs  or gallops.  No carotid bruits Breast:  Not examined Respiratory: Effort normal.  No respiratory distress. No chest wall tenderness. Breath sounds normal.  No wheezes, rales or rhonchi.  Abdomen:  Medicated-tenderness in the lower abdomen GU:  No pelvic done by me Musculoskeletal: Normal range of motion. Extremities are nontender. No cyanosis, edema or clubbing noted Lymphadenopathy: No cervical, preauricular, postauricular or axillary adenopathy is present Skin: Skin is warm and dry. No rash noted. No diaphoresis. No  erythema. No pallor. Pscyh: Normal mood and affect. Behavior is normal. Judgment and thought content normal.   LABORATORY RESULTS: Results for orders placed or performed during the hospital encounter of 11/27/14 (from the past 48 hour(s))  CBC with Differential     Status: Abnormal   Collection Time: 11/27/14  6:19 AM  Result Value Ref Range   WBC 8.7 4.0 - 10.5 K/uL   RBC 5.09 3.87 - 5.11 MIL/uL   Hemoglobin 15.2 (H) 12.0 - 15.0 g/dL   HCT 44.8 36.0 - 46.0 %   MCV 88.0 78.0 - 100.0 fL   MCH 29.9 26.0 - 34.0 pg   MCHC 33.9 30.0 - 36.0 g/dL   RDW 14.4 11.5 - 15.5 %   Platelets 169 150 - 400 K/uL   Neutrophils Relative % 77 43 - 77 %   Neutro Abs 6.7 1.7 - 7.7 K/uL   Lymphocytes Relative 15 12 - 46 %   Lymphs Abs 1.3 0.7 - 4.0 K/uL   Monocytes Relative 3 3 - 12 %   Monocytes Absolute 0.2 0.1 - 1.0 K/uL   Eosinophils Relative 5 0 - 5 %   Eosinophils Absolute 0.4 0.0 - 0.7 K/uL   Basophils Relative 0 0 - 1 %   Basophils Absolute 0.0 0.0 - 0.1 K/uL  Comprehensive metabolic panel     Status: Abnormal   Collection Time: 11/27/14  6:32 AM  Result Value Ref Range   Sodium 139 135 - 145 mmol/L   Potassium 3.5 3.5 - 5.1 mmol/L   Chloride 106 101 - 111 mmol/L   CO2 23 22 - 32 mmol/L   Glucose, Bld 105 (H) 65 - 99 mg/dL   BUN 12 6 - 20 mg/dL   Creatinine, Ser 0.95 0.44 - 1.00 mg/dL   Calcium 9.1 8.9 - 10.3 mg/dL   Total Protein 6.8 6.5 - 8.1 g/dL   Albumin 3.9 3.5 - 5.0 g/dL   AST 17 15 - 41 U/L   ALT 16 14 - 54 U/L   Alkaline Phosphatase 41 38 - 126 U/L   Total Bilirubin 1.0 0.3 - 1.2 mg/dL   GFR calc non Af Amer >60 >60 mL/min   GFR calc Af Amer >60 >60 mL/min    Comment: (NOTE) The eGFR has been calculated using the CKD EPI equation. This calculation has not been validated in all clinical situations. eGFR's persistently <60 mL/min signify possible Chronic Kidney Disease.    Anion gap 10 5 - 15  Lipase, blood     Status: None   Collection Time: 11/27/14  6:32 AM  Result  Value Ref Range   Lipase 22 22 - 51 U/L  Urinalysis, Routine w reflex microscopic (not at [facility])     Status: Abnormal   Collection Time: 11/27/14  7:26 AM  Result Value Ref Range   Color, Urine AMBER (A) YELLOW    Comment: BIOCHEMICALS MAY BE AFFECTED BY COLOR   APPearance CLOUDY (A) CLEAR   Specific Gravity, Urine 1.025 1.005 - 1.030  pH 5.5 5.0 - 8.0   Glucose, UA NEGATIVE NEGATIVE mg/dL   Hgb urine dipstick NEGATIVE NEGATIVE   Bilirubin Urine NEGATIVE NEGATIVE   Ketones, ur 15 (A) NEGATIVE mg/dL   Protein, ur NEGATIVE NEGATIVE mg/dL   Urobilinogen, UA 0.2 0.0 - 1.0 mg/dL   Nitrite NEGATIVE NEGATIVE   Leukocytes, UA NEGATIVE NEGATIVE    Comment: MICROSCOPIC NOT DONE ON URINES WITH NEGATIVE PROTEIN, BLOOD, LEUKOCYTES, NITRITE, OR GLUCOSE <1000 mg/dL.  Wet prep, genital     Status: Abnormal   Collection Time: 11/27/14  8:56 AM  Result Value Ref Range   Yeast Wet Prep HPF POC NONE SEEN NONE SEEN   Trich, Wet Prep NONE SEEN NONE SEEN   Clue Cells Wet Prep HPF POC NONE SEEN NONE SEEN   WBC, Wet Prep HPF POC FEW (A) NONE SEEN     RADIOLOGY RESULTS: Ct Abdomen Pelvis W Contrast  11/27/2014   CLINICAL DATA:  Right lower quadrant pain.  EXAM: CT ABDOMEN AND PELVIS WITH CONTRAST  TECHNIQUE: Multidetector CT imaging of the abdomen and pelvis was performed using the standard protocol following bolus administration of intravenous contrast.  CONTRAST:  19m OMNIPAQUE IOHEXOL 300 MG/ML  SOLN  COMPARISON:  07/28/2014  FINDINGS: Lung bases are clear.  No effusions.  Heart is normal size.  Liver, spleen, pancreas, gallbladder, adrenals and kidneys are unremarkable.  There is moderate free fluid around the liver and spleen as well as in the paracolic gutters and pelvis. Appendix is visualized with a diameter of 9 mm and suggestion of mucosal enhancement. Small amount a gas is noted near the tip of the appendix.  Uterus is unremarkable. Cystic areas noted in the right adnexa could reflect small ovarian  cysts or hydrosalpinx.  Small bowel loops are mildly prominent throughout the abdomen and pelvis. Several loops of small bowel in the pelvis appears thick walled. This could reflect enteritis or secondary inflammation. Large bowel unremarkable. Aorta is normal caliber. No free air or adenopathy.  IMPRESSION: Appendix is mildly prominent, measuring 9 mm in diameter with mucosal enhancement. There is moderate free fluid in the abdomen and pelvis. Given the history of ruptured appendicitis, I cannot exclude recurrent ruptured appendicitis. Overall appearance similar to prior study although the free fluid is larger on today's study.  Areas of small bowel wall thickening in the pelvis could be related to primary enteritis or secondary inflammation.  Small cystic areas within the right adnexa could reflect ovarian cysts or hydrosalpinx.   Electronically Signed   By: KRolm BaptiseM.D.   On: 11/27/2014 09:06    Problem List: Patient Active Problem List   Diagnosis Date Noted  . Ruptured appendicitis 07/14/2014  . Pyrexia 07/10/2014  . Elevated WBC count 07/10/2014  . Right lower quadrant abdominal abscess 07/10/2014  . Infertility, female 06/29/2012  . Family history of breast cancer in mother 06/29/2012    Assessment & Plan: Normal white count and pain presentation suggesting recurrent issues with appendix although not clear cut.  Will treat with antibiotics and probable intervention on this admission    Matt B. MHassell Done MD, FOdessa [facility] Center[facility], [facility] [phone] [phone] 11/27/2014 12:04 PM

## 2014-11-27 NOTE — Progress Notes (Signed)
ANTIBIOTIC CONSULT NOTE - INITIAL  Pharmacy Consult for zosyn Indication: intra-abdominal infection  Allergies  Allergen Reactions  . Other     Walnuts, pecans     Patient Measurements: Height: 5\' 10"  (177.8 cm) Weight: 138 lb (62.596 kg) IBW/kg (Calculated) : 68.5   Vital Signs: Temp: 100 F (37.8 C) (07/04 1320) Temp Source: Oral (07/04 1320) BP: 97/49 mmHg (07/04 1320) Pulse Rate: 70 (07/04 1320) Intake/Output from previous day:   Intake/Output from this shift:    Labs:  Recent Labs  11/27/14 0619 11/27/14 0632  WBC 8.7  --   HGB 15.2*  --   PLT 169  --   CREATININE  --  0.95   Estimated Creatinine Clearance: 80.1 mL/min (by C-G formula based on Cr of 0.95). No results for input(s): VANCOTROUGH, VANCOPEAK, VANCORANDOM, GENTTROUGH, GENTPEAK, GENTRANDOM, TOBRATROUGH, TOBRAPEAK, TOBRARND, AMIKACINPEAK, AMIKACINTROU, AMIKACIN in the last 72 hours.   Microbiology: Recent Results (from the past 720 hour(s))  Wet prep, genital     Status: Abnormal   Collection Time: 11/27/14  8:56 AM  Result Value Ref Range Status   Yeast Wet Prep HPF POC NONE SEEN NONE SEEN Final   Trich, Wet Prep NONE SEEN NONE SEEN Final   Clue Cells Wet Prep HPF POC NONE SEEN NONE SEEN Final   WBC, Wet Prep HPF POC FEW (A) NONE SEEN Final    Medical History: Past Medical History  Diagnosis Date  . Fibroadenoma of breast 2003  . Ulcer   . Cancer      Assessment: 39 y.o. Female presents with recurrent abdominal pain and ascites.  History of ruptured appendicitis with abscess on 2/17 of this year.   Pharmacy consulted to dose zosyn.  Goal of Therapy:  eradication of infection Zosyn per renal function  Plan:  Zosyn 3.375mg  IV q8h extended interval CrCl~68mls/min- Pharmacy will sign off note writing and follow at a distance  Dolly Rias RPh 11/27/2014, 1:47 PM Pager [phone]

## 2014-11-27 NOTE — ED Notes (Signed)
Pt c/o mid to RLQ abd pain onset yesterday morning. Denies emesis, +diarrhea.

## 2014-11-27 NOTE — ED Notes (Signed)
Pelvic is set up patient is with CT.

## 2014-11-28 ENCOUNTER — Observation Stay (HOSPITAL_COMMUNITY): Payer: Managed Care, Other (non HMO) | Admitting: Anesthesiology

## 2014-11-28 ENCOUNTER — Encounter (HOSPITAL_COMMUNITY): Payer: Self-pay | Admitting: Anesthesiology

## 2014-11-28 ENCOUNTER — Encounter (HOSPITAL_COMMUNITY): Admission: EM | Disposition: A | Discharge status: Home Health | Payer: Self-pay | Source: Home / Self Care

## 2014-11-28 DIAGNOSIS — D62 Acute posthemorrhagic anemia: Secondary | ICD-10-CM | POA: Diagnosis not present

## 2014-11-28 DIAGNOSIS — R630 Anorexia: Secondary | ICD-10-CM | POA: Diagnosis present

## 2014-11-28 DIAGNOSIS — N7011 Chronic salpingitis: Secondary | ICD-10-CM | POA: Diagnosis present

## 2014-11-28 DIAGNOSIS — R197 Diarrhea, unspecified: Secondary | ICD-10-CM | POA: Diagnosis present

## 2014-11-28 DIAGNOSIS — Z79899 Other long term (current) drug therapy: Secondary | ICD-10-CM | POA: Diagnosis not present

## 2014-11-28 DIAGNOSIS — N898 Other specified noninflammatory disorders of vagina: Secondary | ICD-10-CM | POA: Diagnosis present

## 2014-11-28 DIAGNOSIS — R1031 Right lower quadrant pain: Secondary | ICD-10-CM | POA: Diagnosis present

## 2014-11-28 DIAGNOSIS — R188 Other ascites: Secondary | ICD-10-CM | POA: Diagnosis present

## 2014-11-28 DIAGNOSIS — K352 Acute appendicitis with generalized peritonitis: Secondary | ICD-10-CM | POA: Diagnosis present

## 2014-11-28 HISTORY — PX: LAPAROSCOPIC ABDOMINAL EXPLORATION: SHX6249

## 2014-11-28 LAB — GC/CHLAMYDIA PROBE AMP (~~LOC~~) NOT AT ARMC
Chlamydia: NEGATIVE
Neisseria Gonorrhea: NEGATIVE

## 2014-11-28 LAB — CBC WITH DIFFERENTIAL/PLATELET
Basophils Absolute: 0 10*3/uL (ref 0.0–0.1)
Basophils Relative: 0 % (ref 0–1)
EOS ABS: 0.1 10*3/uL (ref 0.0–0.7)
EOS PCT: 0 % (ref 0–5)
HCT: 36.6 % (ref 36.0–46.0)
HEMOGLOBIN: 12.5 g/dL (ref 12.0–15.0)
LYMPHS PCT: 7 % — AB (ref 12–46)
Lymphs Abs: 1.1 10*3/uL (ref 0.7–4.0)
MCH: 30 pg (ref 26.0–34.0)
MCHC: 34.2 g/dL (ref 30.0–36.0)
MCV: 88 fL (ref 78.0–100.0)
MONO ABS: 0.5 10*3/uL (ref 0.1–1.0)
MONOS PCT: 3 % (ref 3–12)
NEUTROS ABS: 13.7 10*3/uL — AB (ref 1.7–7.7)
Neutrophils Relative %: 90 % — ABNORMAL HIGH (ref 43–77)
Platelets: 147 10*3/uL — ABNORMAL LOW (ref 150–400)
RBC: 4.16 MIL/uL (ref 3.87–5.11)
RDW: 14.9 % (ref 11.5–15.5)
WBC: 15.4 10*3/uL — AB (ref 4.0–10.5)

## 2014-11-28 LAB — CBC
HCT: 36.7 % (ref 36.0–46.0)
Hemoglobin: 12.1 g/dL (ref 12.0–15.0)
MCH: 28.9 pg (ref 26.0–34.0)
MCHC: 33 g/dL (ref 30.0–36.0)
MCV: 87.8 fL (ref 78.0–100.0)
Platelets: 131 10*3/uL — ABNORMAL LOW (ref 150–400)
RBC: 4.18 MIL/uL (ref 3.87–5.11)
RDW: 14.9 % (ref 11.5–15.5)
WBC: 20.2 10*3/uL — AB (ref 4.0–10.5)

## 2014-11-28 LAB — CREATININE, SERUM
CREATININE: 0.75 mg/dL (ref 0.44–1.00)
GFR calc non Af Amer: >60 mL/min (ref >60)

## 2014-11-28 LAB — GRAM STAIN

## 2014-11-28 LAB — SURGICAL PCR SCREEN
MRSA, PCR: NEGATIVE
Staphylococcus aureus: NEGATIVE

## 2014-11-28 LAB — HIV ANTIBODY (ROUTINE TESTING W REFLEX): HIV SCREEN 4TH GENERATION: NONREACTIVE

## 2014-11-28 SURGERY — EXPLORATION, ABDOMEN, LAPAROSCOPIC
Anesthesia: General | Site: Abdomen

## 2014-11-28 MED ORDER — NEOSTIGMINE METHYLSULFATE 10 MG/10ML IV SOLN
INTRAVENOUS | Status: DC | PRN
Start: 2014-11-28 — End: 2014-11-28
  Administered 2014-11-28: 4 mg via INTRAVENOUS

## 2014-11-28 MED ORDER — ONDANSETRON HCL 4 MG PO TABS: 4.0000 mg | ORAL_TABLET | Freq: Four times a day (QID) | ORAL | Status: DC | PRN

## 2014-11-28 MED ORDER — ONDANSETRON HCL 4 MG/2ML IJ SOLN
INTRAMUSCULAR | Status: DC | PRN
  Administered 2014-11-28: 4 mg via INTRAVENOUS

## 2014-11-28 MED ORDER — MIDAZOLAM HCL 2 MG/2ML IJ SOLN
INTRAMUSCULAR | Status: AC
Start: 2014-11-28 — End: 2014-11-28
  Filled 2014-11-28: qty 2

## 2014-11-28 MED ORDER — SODIUM CHLORIDE 0.9 % IV BOLUS (SEPSIS)
500.0000 mL | Freq: Once | INTRAVENOUS | Status: AC
  Administered 2014-11-28: 500 mL via INTRAVENOUS

## 2014-11-28 MED ORDER — HYDROMORPHONE HCL 1 MG/ML IJ SOLN
INTRAMUSCULAR | Status: DC | PRN
  Administered 2014-11-28 (×2): 1 mg via INTRAVENOUS

## 2014-11-28 MED ORDER — BUPIVACAINE-EPINEPHRINE 0.25% -1:200000 IJ SOLN
INTRAMUSCULAR | Status: DC | PRN
  Administered 2014-11-28: 7 mL

## 2014-11-28 MED ORDER — PROPOFOL 10 MG/ML IV BOLUS
INTRAVENOUS | Status: DC | PRN
  Administered 2014-11-28: 160 mg via INTRAVENOUS

## 2014-11-28 MED ORDER — HYDROMORPHONE HCL 1 MG/ML IJ SOLN
1.0000 mg | INTRAMUSCULAR | Status: DC | PRN
  Administered 2014-11-28 – 2014-11-29 (×6): 1 mg via INTRAVENOUS
  Filled 2014-11-28 (×6): qty 1

## 2014-11-28 MED ORDER — OXYCODONE-ACETAMINOPHEN 5-325 MG PO TABS
1.0000 | ORAL_TABLET | ORAL | Status: DC | PRN
  Administered 2014-11-30 (×6): 2 via ORAL
  Administered 2014-12-01: 1 via ORAL
  Filled 2014-11-28: qty 1
  Filled 2014-11-28 (×3): qty 2
  Filled 2014-11-28: qty 1
  Filled 2014-11-28 (×3): qty 2

## 2014-11-28 MED ORDER — MIDAZOLAM HCL 5 MG/5ML IJ SOLN
INTRAMUSCULAR | Status: DC | PRN
  Administered 2014-11-28: 1.5 mg via INTRAVENOUS
  Administered 2014-11-28: 0.5 mg via INTRAVENOUS

## 2014-11-28 MED ORDER — EPHEDRINE SULFATE 50 MG/ML IJ SOLN
INTRAMUSCULAR | Status: DC | PRN
  Administered 2014-11-28: 5 mg via INTRAVENOUS

## 2014-11-28 MED ORDER — HEPARIN SODIUM (PORCINE) 5000 UNIT/ML IJ SOLN: 5000.0000 [IU] | Freq: Three times a day (TID) | INTRAMUSCULAR | Status: DC

## 2014-11-28 MED ORDER — FENTANYL CITRATE (PF) 250 MCG/5ML IJ SOLN
INTRAMUSCULAR | Status: AC
Start: 2014-11-28 — End: 2014-11-28
  Filled 2014-11-28: qty 5

## 2014-11-28 MED ORDER — HYDROMORPHONE HCL 2 MG/ML IJ SOLN
INTRAMUSCULAR | Status: AC
Start: 2014-11-28 — End: 2014-11-28
  Filled 2014-11-28: qty 1

## 2014-11-28 MED ORDER — LACTATED RINGERS IV SOLN
INTRAVENOUS | Status: DC | PRN
  Administered 2014-11-28: 1000 mL via INTRAVENOUS

## 2014-11-28 MED ORDER — DEXAMETHASONE SODIUM PHOSPHATE 10 MG/ML IJ SOLN
INTRAMUSCULAR | Status: DC | PRN
  Administered 2014-11-28: 4 mg via INTRAVENOUS

## 2014-11-28 MED ORDER — SODIUM CHLORIDE 0.9 % IR SOLN
Status: DC | PRN
Start: 2014-11-28 — End: 2014-11-28
  Administered 2014-11-28: 6000 mL

## 2014-11-28 MED ORDER — MENTHOL 3 MG MT LOZG
1.0000 | LOZENGE | OROMUCOSAL | Status: DC | PRN
  Filled 2014-11-28: qty 9

## 2014-11-28 MED ORDER — POTASSIUM CHLORIDE IN NACL 20-0.9 MEQ/L-% IV SOLN
INTRAVENOUS | Status: DC
  Administered 2014-11-28 – 2014-12-02 (×9): via INTRAVENOUS
  Filled 2014-11-28 (×13): qty 1000

## 2014-11-28 MED ORDER — ONDANSETRON HCL 4 MG/2ML IJ SOLN
INTRAMUSCULAR | Status: AC
  Filled 2014-11-28: qty 2

## 2014-11-28 MED ORDER — LACTATED RINGERS IV SOLN
INTRAVENOUS | Status: DC | PRN
  Administered 2014-11-28 (×3): via INTRAVENOUS

## 2014-11-28 MED ORDER — GLYCOPYRROLATE 0.2 MG/ML IJ SOLN
INTRAMUSCULAR | Status: DC | PRN
  Administered 2014-11-28: 0.6 mg via INTRAVENOUS

## 2014-11-28 MED ORDER — FENTANYL CITRATE (PF) 250 MCG/5ML IJ SOLN
INTRAMUSCULAR | Status: AC
  Filled 2014-11-28: qty 5

## 2014-11-28 MED ORDER — LACTATED RINGERS IV SOLN: INTRAVENOUS | Status: DC

## 2014-11-28 MED ORDER — HYDROMORPHONE HCL 1 MG/ML IJ SOLN
0.2500 mg | INTRAMUSCULAR | Status: DC | PRN
Start: 2014-11-28 — End: 2014-11-28
  Administered 2014-11-28 (×2): 0.5 mg via INTRAVENOUS

## 2014-11-28 MED ORDER — ROCURONIUM BROMIDE 100 MG/10ML IV SOLN
INTRAVENOUS | Status: AC
  Filled 2014-11-28: qty 1

## 2014-11-28 MED ORDER — METOCLOPRAMIDE HCL 5 MG/ML IJ SOLN
INTRAMUSCULAR | Status: DC | PRN
  Administered 2014-11-28: 10 mg via INTRAVENOUS

## 2014-11-28 MED ORDER — SODIUM CHLORIDE 0.9 % IJ SOLN
INTRAMUSCULAR | Status: AC
  Filled 2014-11-28: qty 10

## 2014-11-28 MED ORDER — HYDROMORPHONE HCL 1 MG/ML IJ SOLN
INTRAMUSCULAR | Status: AC
  Filled 2014-11-28: qty 1

## 2014-11-28 MED ORDER — LIDOCAINE HCL (CARDIAC) 20 MG/ML IV SOLN
INTRAVENOUS | Status: DC | PRN
  Administered 2014-11-28: 60 mg via INTRAVENOUS

## 2014-11-28 MED ORDER — BUPIVACAINE-EPINEPHRINE (PF) 0.5% -1:200000 IJ SOLN
INTRAMUSCULAR | Status: AC
  Filled 2014-11-28: qty 30

## 2014-11-28 MED ORDER — FENTANYL CITRATE (PF) 100 MCG/2ML IJ SOLN
INTRAMUSCULAR | Status: DC | PRN
  Administered 2014-11-28: 50 ug via INTRAVENOUS
  Administered 2014-11-28 (×3): 25 ug via INTRAVENOUS
  Administered 2014-11-28: 75 ug via INTRAVENOUS
  Administered 2014-11-28: 50 ug via INTRAVENOUS

## 2014-11-28 MED ORDER — NEOSTIGMINE METHYLSULFATE 10 MG/10ML IV SOLN
INTRAVENOUS | Status: AC
  Filled 2014-11-28: qty 1

## 2014-11-28 MED ORDER — PROPOFOL 10 MG/ML IV BOLUS
INTRAVENOUS | Status: AC
  Filled 2014-11-28: qty 20

## 2014-11-28 MED ORDER — ROCURONIUM BROMIDE 100 MG/10ML IV SOLN
INTRAVENOUS | Status: DC | PRN
  Administered 2014-11-28: 20 mg via INTRAVENOUS
  Administered 2014-11-28: 5 mg via INTRAVENOUS
  Administered 2014-11-28: 20 mg via INTRAVENOUS
  Administered 2014-11-28: 5 mg via INTRAVENOUS

## 2014-11-28 MED ORDER — EPHEDRINE SULFATE 50 MG/ML IJ SOLN
INTRAMUSCULAR | Status: AC
  Filled 2014-11-28: qty 1

## 2014-11-28 MED ORDER — LACTATED RINGERS IV SOLN
INTRAVENOUS | Status: DC
  Administered 2014-11-28: 1000 mL via INTRAVENOUS

## 2014-11-28 MED ORDER — GLYCOPYRROLATE 0.2 MG/ML IJ SOLN
INTRAMUSCULAR | Status: AC
  Filled 2014-11-28: qty 3

## 2014-11-28 MED ORDER — LIDOCAINE HCL (CARDIAC) 20 MG/ML IV SOLN
INTRAVENOUS | Status: AC
  Filled 2014-11-28: qty 5

## 2014-11-28 MED ORDER — ONDANSETRON HCL 4 MG/2ML IJ SOLN
4.0000 mg | Freq: Four times a day (QID) | INTRAMUSCULAR | Status: DC | PRN
  Administered 2014-11-30 – 2014-12-01 (×3): 4 mg via INTRAVENOUS
  Filled 2014-11-28 (×3): qty 2

## 2014-11-28 MED ORDER — SUCCINYLCHOLINE CHLORIDE 20 MG/ML IJ SOLN
INTRAMUSCULAR | Status: DC | PRN
  Administered 2014-11-28: 100 mg via INTRAVENOUS

## 2014-11-28 SURGICAL SUPPLY — 61 items
APPLICATOR COTTON TIP 6IN STRL (MISCELLANEOUS) IMPLANT
BENZOIN TINCTURE PRP APPL 2/3 (GAUZE/BANDAGES/DRESSINGS) IMPLANT
BLADE EXTENDED COATED 6.5IN (ELECTRODE) IMPLANT
BLADE HEX COATED 2.75 (ELECTRODE) ×3 IMPLANT
CLOSURE WOUND 1/2 X4 (GAUZE/BANDAGES/DRESSINGS)
COVER MAYO STAND STRL (DRAPES) ×3 IMPLANT
DECANTER SPIKE VIAL GLASS SM (MISCELLANEOUS) IMPLANT
DERMABOND ADVANCED (GAUZE/BANDAGES/DRESSINGS) ×2
DERMABOND ADVANCED .7 DNX12 (GAUZE/BANDAGES/DRESSINGS) ×1 IMPLANT
DRAIN CHANNEL 19F RND (DRAIN) ×3 IMPLANT
DRAPE LAPAROSCOPIC ABDOMINAL (DRAPES) IMPLANT
DRAPE POUCH INSTRU U-SHP 10X18 (DRAPES) ×3 IMPLANT
DRAPE WARM FLUID 44X44 (DRAPE) ×3 IMPLANT
ELECT REM PT RETURN 9FT ADLT (ELECTROSURGICAL) ×3
ELECTRODE REM PT RTRN 9FT ADLT (ELECTROSURGICAL) ×1 IMPLANT
EVACUATOR SILICONE 100CC (DRAIN) ×3 IMPLANT
GAUZE SPONGE 4X4 12PLY STRL (GAUZE/BANDAGES/DRESSINGS) ×3 IMPLANT
GLOVE BIOGEL PI IND STRL 7.0 (GLOVE) ×1 IMPLANT
GLOVE BIOGEL PI INDICATOR 7.0 (GLOVE) ×2
GLOVE EUDERMIC 7 POWDERFREE (GLOVE) ×3 IMPLANT
GOWN STRL REUS W/TWL LRG LVL3 (GOWN DISPOSABLE) IMPLANT
GOWN STRL REUS W/TWL XL LVL3 (GOWN DISPOSABLE) ×12 IMPLANT
KIT BASIN OR (CUSTOM PROCEDURE TRAY) ×3 IMPLANT
NS IRRIG 1000ML POUR BTL (IV SOLUTION) ×3 IMPLANT
PACK GENERAL/GYN (CUSTOM PROCEDURE TRAY) ×3 IMPLANT
PAD ABD 8X10 STRL (GAUZE/BANDAGES/DRESSINGS) ×3 IMPLANT
SET IRRIG TUBING LAPAROSCOPIC (IRRIGATION / IRRIGATOR) IMPLANT
SHEARS HARMONIC ACE PLUS 36CM (ENDOMECHANICALS) ×3 IMPLANT
SOLUTION ANTI FOG 6CC (MISCELLANEOUS) IMPLANT
SPONGE LAP 18X18 X RAY DECT (DISPOSABLE) ×6 IMPLANT
STAPLER VISISTAT 35W (STAPLE) ×3 IMPLANT
STRIP CLOSURE SKIN 1/2X4 (GAUZE/BANDAGES/DRESSINGS) IMPLANT
SUCTION POOLE TIP (SUCTIONS) ×3 IMPLANT
SUT ETHILON 3 0 PS 1 (SUTURE) ×3 IMPLANT
SUT MNCRL AB 4-0 PS2 18 (SUTURE) ×3 IMPLANT
SUT PDS AB 1 CTX 36 (SUTURE) IMPLANT
SUT PDS AB 1 TP1 96 (SUTURE) ×6 IMPLANT
SUT SILK 2 0 (SUTURE) ×2
SUT SILK 2 0 SH CR/8 (SUTURE) IMPLANT
SUT SILK 2-0 18XBRD TIE 12 (SUTURE) ×1 IMPLANT
SUT SILK 3 0 (SUTURE)
SUT SILK 3 0 SH CR/8 (SUTURE) IMPLANT
SUT SILK 3-0 18XBRD TIE 12 (SUTURE) IMPLANT
SUT VIC AB 0 BRD 54 (SUTURE) ×3 IMPLANT
SUT VIC AB 2-0 SH 18 (SUTURE) ×3 IMPLANT
SUT VIC AB 4-0 PS2 27 (SUTURE) IMPLANT
SUT VICRYL 0 UR6 27IN ABS (SUTURE) ×6 IMPLANT
SUT VICRYL 2 0 18  UND BR (SUTURE)
SUT VICRYL 2 0 18 UND BR (SUTURE) IMPLANT
TAPE CLOTH SURG 4X10 WHT LF (GAUZE/BANDAGES/DRESSINGS) ×3 IMPLANT
TOWEL OR 17X26 10 PK STRL BLUE (TOWEL DISPOSABLE) ×3 IMPLANT
TRAP SPECIMEN MUCOUS 40CC (MISCELLANEOUS) ×3 IMPLANT
TRAY FOLEY W/METER SILVER 14FR (SET/KITS/TRAYS/PACK) ×3 IMPLANT
TRAY LAPAROSCOPIC (CUSTOM PROCEDURE TRAY) ×3 IMPLANT
TROCAR BLADELESS OPT 5 75 (ENDOMECHANICALS) IMPLANT
TROCAR XCEL BLUNT TIP 100MML (ENDOMECHANICALS) ×3 IMPLANT
TROCAR XCEL NON-BLD 11X100MML (ENDOMECHANICALS) IMPLANT
TROCAR XCEL UNIV SLVE 11M 100M (ENDOMECHANICALS) IMPLANT
TUBING INSUFFLATION 10FT LAP (TUBING) ×3 IMPLANT
WATER STERILE IRR 1500ML POUR (IV SOLUTION) ×3 IMPLANT
YANKAUER SUCT BULB TIP NO VENT (SUCTIONS) IMPLANT

## 2014-11-28 NOTE — Anesthesia Postprocedure Evaluation (Signed)
  Anesthesia Post-op Note  Patient: Katrina Deleon  Procedure(s) Performed: Procedure(s) (LRB): LAPAROSCOPY,LAPAROTOMY,APPENDECTOMY,RIGHT SALPINGECTOMY (N/A)  Patient Location: PACU  Anesthesia Type: General  Level of Consciousness: awake and alert   Airway and Oxygen Therapy: Patient Spontanous Breathing  Post-op Pain: mild  Post-op Assessment: Post-op Vital signs reviewed, Patient's Cardiovascular Status Stable, Respiratory Function Stable, Patent Airway and No signs of Nausea or vomiting  Last Vitals:  Filed Vitals:   11/28/14 1556  BP: 98/53  Pulse: 73  Temp: 37.1 C  Resp: 16    Post-op Vital Signs: stable   Complications: No apparent anesthesia complications

## 2014-11-28 NOTE — Transfer of Care (Signed)
Immediate Anesthesia Transfer of Care Note  Patient: Katrina Deleon  Procedure(s) Performed: Procedure(s): LAPAROSCOPY,LAPAROTOMY,APPENDECTOMY,RIGHT SALPINGECTOMY (N/A)  Patient Location: PACU  Anesthesia Type:General  Level of Consciousness: awake, oriented and patient cooperative  Airway & Oxygen Therapy: Patient Spontanous Breathing and Patient connected to face mask oxygen  Post-op Assessment: Report given to RN and Post -op Vital signs reviewed and stable  Post vital signs: Reviewed and stable  Last Vitals:  Filed Vitals:   11/28/14 1145  BP: 122/59  Pulse: 93  Temp: 37.2 C  Resp: 15    Complications: No apparent anesthesia complications

## 2014-11-28 NOTE — Op Note (Addendum)
Patient Name:           Katrina Deleon   Date of Surgery:        11/28/2014  Pre op Diagnosis:      Recurrent appendicitis  Post op Diagnosis:    Recurrent appendicitis, right hydrosalpinx  Procedure:                 Diagnostic laparoscopy, peritoneal washings for culture, laparotomy, appendectomy, right salpingectomy  Surgeon:                     Edsel Petrin. Dalbert Batman, M.D., FACS  Assistant:                      OR staff  Intraoperative consult:    Dr. Janie Morning  Specimens:                     Appendix.  Right fallopian tube.  Peritoneal fluid.  Operative Indications:   This is a healthy 39 year old [place] female who underwent laparoscopy and drainage of purulent peritonitis by Dr. Alphonsa Overall in February of this year.  His operative note states that he searched for the appendix but could not find the appendix.  With antibiotic's and drainage she fully recovered.   48 hours ago she developed abdominal pain and some diarrhea.  She was admitted by Dr. Johnathan Hausen yesterday.  CT scan showed what looked like the appendix with some aerated.  No free air.  She has some fluid in the pelvis, some fluid over the liver, and some fluid in the left upper quadrant.  This morning she remains tender, fairly diffusely but more so in the lower abdomen.  She had low-grade fever and leukocytosis.  She is brought operating room for exploration and possible appendectomy  Operative Findings:       There was a purulent vaginal discharge noted when the Foley catheter was inserted.  The urine was clear.  There was a right lower quadrant inflammatory process.  There were a few loops of small bowel with inflammatory exudate present.  The appendix was densely adherent to the lateral aspect of the right tube and ovary and it looked chronically and acutely inflamed.  The right ovary looks normal.  The right tube was soft but was quite swollen.  There was no purulence coming from the fallopian tube.  The  left tube and ovary looked fairly normal although it was tethered in the pelvis.  Dr. Skeet Latch recommended that the right fallopian tube be removed because of its appearance and the history and the risk of future ectopic.Katrina Deleon  There was some bloody brownish fluid in the right subphrenic and left subphrenic areas.  This really did not have much odor.  Peritoneal washings were obtained for culture.  I ran the small bowel and other than an inflammatory exudate on the last 15 inches, it was normal.:  Felt soft.  The liver felt normal.  Procedure in Detail:          Following the induction of a general endotracheal anesthesia Foley catheter was placed. A purulent vaginal discharge was noted.  The abdomen was prepped and draped in a sterile fashion.  The patient had received intravenous antibiotic's on schedule just prior to coming to the operating room.  0.5% Marcaine with epinephrine was used as local infiltration anesthesia the following a surgical timeout.     A 5 mm optical trocar was inserted into the left  subcostal region.  Optical entry was uneventful.  The small bowel appeared somewhat distended diffusely.  There was turbid fluid in the pelvis and both subphrenic areas.  This was suctioned out into a trap and sent for Gram stain and culture.  I put 2 other 5 mm trochars in the left side.  I identified the terminal ileum and ileocecal valve.  I could identify the cecum.  I felt that I can see the base of the appendix but then the appendix was densely adherent into the right adnexa and due to poor visualization of the anatomy I chose to convert to an open operation.      The trochars were removed.  A lower midline incision was made.  The fascia was incised in the midline.  The abdominal cavity was entered.  With blunt dissection and direct visualization I was able to completely free up the appendix from the right adnexa.  The appendix  looked acutely and chronically inflamed.  There was no enteric contamination.   I divided the mesoappendix with the Harmonic scalpel.  I transected the appendix taking a cuff of cecum using a GIA stapling device.  The appendix was passed off and sent to the lab.  Closure looked good and was inspected several times.  I then further examined the pelvis.  I asked Dr. Janie Morning to look at the fallopian tube.  She reviewed the patient's history and felt that the right fallopian tube was quite distended although soft.  She felt the patient was at high risk for ectopic pregnancy and recommended removing the right fallopian tube.  I clamped the mesentery of the fallopian tube right next to the tube and divided that.  I clamped the right fallopian tube as it attached to the uterus and divided this.  This was double ligated with a 0 Vicryl   ties.  The mesentery of the fallopian tube was suture ligated with multiple figure-of-eight sutures of 0 vicryl.  We then copiously irrigated the of the abdomen, subphrenic spaces and pelvis with about 8 L of saline.  We  did this until the fluid was completely clear.  We ran the small bowel and looked at the colon: Everything else looked fine.  I felt that the inflammatory process was clearly in the right lower quadrant.  After  removing all irrigation fluid I placed a 76 [place] Blake drain down into the cul-de-sac and brought that out through one of the trocar sites in the left lower quadrant.  This was sutured to the skin with nylon suture.  Midline fascia was closed with a running suture of #1, double-strand PDS and skin packed open.  The other trocar sites were closed with 4-0 Monocryl and Dermabond.  The patient tolerated the procedure well was taken to PACU in stable condition.  EBL 75 mL.  Counts correct.  Complications none.     Edsel Petrin. Dalbert Batman, M.D., FACS General and Minimally Invasive Surgery Breast and Colorectal Surgery  11/28/2014 11:28 AM

## 2014-11-28 NOTE — Anesthesia Procedure Notes (Signed)
Procedure Name: Intubation Date/Time: 11/28/2014 9:50 AM Performed by: Sherian Maroon A Pre-anesthesia Checklist: Patient identified, Emergency Drugs available, Suction available, Patient being monitored and Timeout performed Patient Re-evaluated:Patient Re-evaluated prior to inductionOxygen Delivery Method: Circle system utilized Preoxygenation: Pre-oxygenation with 100% oxygen Intubation Type: IV induction, Rapid sequence and Cricoid Pressure applied Grade View: Grade II Tube type: Oral Tube size: 7.5 mm Number of attempts: 1 Placement Confirmation: ETT inserted through vocal cords under direct vision,  positive ETCO2 and breath sounds checked- equal and bilateral Secured at: 21 cm Tube secured with: Tape Dental Injury: Teeth and Oropharynx as per pre-operative assessment

## 2014-11-28 NOTE — Anesthesia Preprocedure Evaluation (Signed)
Anesthesia Evaluation  Patient identified by MRN, date of birth, ID band Patient awake    Reviewed: Allergy & Precautions, H&P , NPO status , Patient's Chart, lab work & pertinent test results  Airway Mallampati: II  TM Distance: >3 FB Neck ROM: Full    Dental no notable dental hx. (+) Dental Advisory Given, Teeth Intact   Pulmonary neg pulmonary ROS,  breath sounds clear to auscultation  Pulmonary exam normal       Cardiovascular Exercise Tolerance: Good negative cardio ROS Normal cardiovascular examRhythm:Regular Rate:Normal     Neuro/Psych negative neurological ROS  negative psych ROS   GI/Hepatic negative GI ROS, Neg liver ROS,   Endo/Other  negative endocrine ROS  Renal/GU negative Renal ROS  negative genitourinary   Musculoskeletal negative musculoskeletal ROS (+)   Abdominal   Peds negative pediatric ROS (+)  Hematology negative hematology ROS (+)   Anesthesia Other Findings   Reproductive/Obstetrics negative OB ROS                             Anesthesia Physical Anesthesia Plan  ASA: II and emergent  Anesthesia Plan: General   Post-op Pain Management:    Induction: Intravenous, Rapid sequence and Cricoid pressure planned  Airway Management Planned: Oral ETT  Additional Equipment:   Intra-op Plan:   Post-operative Plan: Extubation in OR  Informed Consent: I have reviewed the patients History and Physical, chart, labs and discussed the procedure including the risks, benefits and alternatives for the proposed anesthesia with the patient or authorized representative who has indicated his/her understanding and acceptance.   Dental Advisory Given  Plan Discussed with: CRNA and Surgeon  Anesthesia Plan Comments:         Anesthesia Quick Evaluation

## 2014-11-28 NOTE — Progress Notes (Signed)
Patient ID: Katrina Deleon, female   DOB: 06-Jun-1975, 39 y.o.   MRN: [id number]     [address] [place], [place] [address]    Phone: [phone] FAX: [phone]     Subjective: No better, no worse.  WBC up.  Febrile.    Objective:  Vital signs:  Filed Vitals:   11/27/14 1819 11/27/14 2102 11/28/14 0204 11/28/14 0534  BP: '93/44 94/43 94/45 ' 101/48  Pulse: 81 80 80 81  Temp: 100.8 F (38.2 C) 100.6 F (38.1 C) 100 F (37.8 C) 100.2 F (37.9 C)  TempSrc: Oral Oral Oral Oral  Resp: '16 14 14 14  ' Height:      Weight:      SpO2: 99% 98% 99% 100%    Last BM Date: 11/26/14  Intake/Output   Yesterday:  07/04 0701 - 07/05 0700 In: 7824 [I.V.:1600; IV Piggyback:50] Out: 350 [Urine:350] This shift:    I/O last 3 completed shifts: In: 2353 [I.V.:1600; IV Piggyback:50] Out: 350 [Urine:350]    Physical Exam: General: Pt awake/alert/oriented x4 in no acute distress Chest: cta. No chest wall pain w good excursion CV:  Pulses intact.  Regular rhythm MS: Normal AROM mjr joints.  No obvious deformity Abdomen: Soft.  Nondistended. Generalized ttp with voluntary guarding.   No evidence of peritonitis.  No incarcerated hernias. Ext:  SCDs BLE.  No mjr edema.  No cyanosis Skin: No petechiae / purpura   Problem List:   Active Problems:   Abdominal pain    Results:   Labs: Results for orders placed or performed during the hospital encounter of 11/27/14 (from the past 48 hour(s))  CBC with Differential     Status: Abnormal   Collection Time: 11/27/14  6:19 AM  Result Value Ref Range   WBC 8.7 4.0 - 10.5 K/uL   RBC 5.09 3.87 - 5.11 MIL/uL   Hemoglobin 15.2 (H) 12.0 - 15.0 g/dL   HCT 44.8 36.0 - 46.0 %   MCV 88.0 78.0 - 100.0 fL   MCH 29.9 26.0 - 34.0 pg   MCHC 33.9 30.0 - 36.0 g/dL   RDW 14.4 11.5 - 15.5 %   Platelets 169 150 - 400 K/uL   Neutrophils Relative % 77 43 - 77 %   Neutro Abs 6.7 1.7 -  7.7 K/uL   Lymphocytes Relative 15 12 - 46 %   Lymphs Abs 1.3 0.7 - 4.0 K/uL   Monocytes Relative 3 3 - 12 %   Monocytes Absolute 0.2 0.1 - 1.0 K/uL   Eosinophils Relative 5 0 - 5 %   Eosinophils Absolute 0.4 0.0 - 0.7 K/uL   Basophils Relative 0 0 - 1 %   Basophils Absolute 0.0 0.0 - 0.1 K/uL  Comprehensive metabolic panel     Status: Abnormal   Collection Time: 11/27/14  6:32 AM  Result Value Ref Range   Sodium 139 135 - 145 mmol/L   Potassium 3.5 3.5 - 5.1 mmol/L   Chloride 106 101 - 111 mmol/L   CO2 23 22 - 32 mmol/L   Glucose, Bld 105 (H) 65 - 99 mg/dL   BUN 12 6 - 20 mg/dL   Creatinine, Ser 0.95 0.44 - 1.00 mg/dL   Calcium 9.1 8.9 - 10.3 mg/dL   Total Protein 6.8 6.5 - 8.1 g/dL   Albumin 3.9 3.5 - 5.0 g/dL   AST 17 15 - 41 U/L   ALT  16 14 - 54 U/L   Alkaline Phosphatase 41 38 - 126 U/L   Total Bilirubin 1.0 0.3 - 1.2 mg/dL   GFR calc non Af Amer >60 >60 mL/min   GFR calc Af Amer >60 >60 mL/min    Comment: (NOTE) The eGFR has been calculated using the CKD EPI equation. This calculation has not been validated in all clinical situations. eGFR's persistently <60 mL/min signify possible Chronic Kidney Disease.    Anion gap 10 5 - 15  Lipase, blood     Status: None   Collection Time: 11/27/14  6:32 AM  Result Value Ref Range   Lipase 22 22 - 51 U/L  Urinalysis, Routine w reflex microscopic (not at [facility])     Status: Abnormal   Collection Time: 11/27/14  7:26 AM  Result Value Ref Range   Color, Urine AMBER (A) YELLOW    Comment: BIOCHEMICALS MAY BE AFFECTED BY COLOR   APPearance CLOUDY (A) CLEAR   Specific Gravity, Urine 1.025 1.005 - 1.030   pH 5.5 5.0 - 8.0   Glucose, UA NEGATIVE NEGATIVE mg/dL   Hgb urine dipstick NEGATIVE NEGATIVE   Bilirubin Urine NEGATIVE NEGATIVE   Ketones, ur 15 (A) NEGATIVE mg/dL   Protein, ur NEGATIVE NEGATIVE mg/dL   Urobilinogen, UA 0.2 0.0 - 1.0 mg/dL   Nitrite NEGATIVE NEGATIVE   Leukocytes, UA NEGATIVE NEGATIVE    Comment:  MICROSCOPIC NOT DONE ON URINES WITH NEGATIVE PROTEIN, BLOOD, LEUKOCYTES, NITRITE, OR GLUCOSE <1000 mg/dL.  Wet prep, genital     Status: Abnormal   Collection Time: 11/27/14  8:56 AM  Result Value Ref Range   Yeast Wet Prep HPF POC NONE SEEN NONE SEEN   Trich, Wet Prep NONE SEEN NONE SEEN   Clue Cells Wet Prep HPF POC NONE SEEN NONE SEEN   WBC, Wet Prep HPF POC FEW (A) NONE SEEN  CBC WITH DIFFERENTIAL     Status: Abnormal   Collection Time: 11/28/14  4:12 AM  Result Value Ref Range   WBC 15.4 (H) 4.0 - 10.5 K/uL   RBC 4.16 3.87 - 5.11 MIL/uL   Hemoglobin 12.5 12.0 - 15.0 g/dL   HCT 36.6 36.0 - 46.0 %   MCV 88.0 78.0 - 100.0 fL   MCH 30.0 26.0 - 34.0 pg   MCHC 34.2 30.0 - 36.0 g/dL   RDW 14.9 11.5 - 15.5 %   Platelets 147 (L) 150 - 400 K/uL   Neutrophils Relative % 90 (H) 43 - 77 %   Neutro Abs 13.7 (H) 1.7 - 7.7 K/uL   Lymphocytes Relative 7 (L) 12 - 46 %   Lymphs Abs 1.1 0.7 - 4.0 K/uL   Monocytes Relative 3 3 - 12 %   Monocytes Absolute 0.5 0.1 - 1.0 K/uL   Eosinophils Relative 0 0 - 5 %   Eosinophils Absolute 0.1 0.0 - 0.7 K/uL   Basophils Relative 0 0 - 1 %   Basophils Absolute 0.0 0.0 - 0.1 K/uL    Imaging / Studies: Ct Abdomen Pelvis W Contrast  11/27/2014   CLINICAL DATA:  Right lower quadrant pain.  EXAM: CT ABDOMEN AND PELVIS WITH CONTRAST  TECHNIQUE: Multidetector CT imaging of the abdomen and pelvis was performed using the standard protocol following bolus administration of intravenous contrast.  CONTRAST:  115m OMNIPAQUE IOHEXOL 300 MG/ML  SOLN  COMPARISON:  07/28/2014  FINDINGS: Lung bases are clear.  No effusions.  Heart is normal size.  Liver, spleen, pancreas, gallbladder, adrenals  and kidneys are unremarkable.  There is moderate free fluid around the liver and spleen as well as in the paracolic gutters and pelvis. Appendix is visualized with a diameter of 9 mm and suggestion of mucosal enhancement. Small amount a gas is noted near the tip of the appendix.  Uterus is  unremarkable. Cystic areas noted in the right adnexa could reflect small ovarian cysts or hydrosalpinx.  Small bowel loops are mildly prominent throughout the abdomen and pelvis. Several loops of small bowel in the pelvis appears thick walled. This could reflect enteritis or secondary inflammation. Large bowel unremarkable. Aorta is normal caliber. No free air or adenopathy.  IMPRESSION: Appendix is mildly prominent, measuring 9 mm in diameter with mucosal enhancement. There is moderate free fluid in the abdomen and pelvis. Given the history of ruptured appendicitis, I cannot exclude recurrent ruptured appendicitis. Overall appearance similar to prior study although the free fluid is larger on today's study.  Areas of small bowel wall thickening in the pelvis could be related to primary enteritis or secondary inflammation.  Small cystic areas within the right adnexa could reflect ovarian cysts or hydrosalpinx.   Electronically Signed   By: Rolm Baptise M.D.   On: 11/27/2014 09:06    Medications / Allergies:  Scheduled Meds: . heparin  5,000 Units Subcutaneous 3 times per day  . pantoprazole (PROTONIX) IV  40 mg Intravenous QHS  . piperacillin-tazobactam (ZOSYN)  IV  3.375 g Intravenous 3 times per day  . sodium chloride  500 mL Intravenous Once   Continuous Infusions: . dextrose 5 % and 0.45 % NaCl with KCl 20 mEq/L 100 mL/hr at 11/28/14 0600   PRN Meds:.diphenhydrAMINE **OR** diphenhydrAMINE, HYDROmorphone (DILAUDID) injection, ondansetron  Antibiotics: Anti-infectives    Start     Dose/Rate Route Frequency Ordered Stop   11/27/14 1400  piperacillin-tazobactam (ZOSYN) IVPB 3.375 g     3.375 g 12.5 mL/hr over 240 Minutes Intravenous 3 times per day 11/27/14 1339          Assessment/Plan Possible ruptured appendix: WBC increased, fevers and exhibiting peritoneal signs.  Will proceed with a diagnostic laparoscopy, possible laparotomy this morning with Dr. Dalbert Batman.  Surgical risks discussed  including but not limited to infection, bleeding, ileocecectomy, anesthesia.  The patient verbalizes understanding and wishes to proceed.   ID-zosyn D#1 VTE prophylaxis-SCD/heparin FEN-NPO, give 5104m fluid bolus for soft BP and low UOP.  Check AM labs.  Dispo-to OR   EErby Pian A[facility]Sur[facility] Pager [phone](7A-4:30P)   11/28/2014 7:44 AM

## 2014-11-29 ENCOUNTER — Encounter (HOSPITAL_COMMUNITY): Payer: Self-pay | Admitting: General Surgery

## 2014-11-29 LAB — CBC
HCT: 32.5 % — ABNORMAL LOW (ref 36.0–46.0)
Hemoglobin: 11.1 g/dL — ABNORMAL LOW (ref 12.0–15.0)
MCH: 30.2 pg (ref 26.0–34.0)
MCHC: 34.2 g/dL (ref 30.0–36.0)
MCV: 88.3 fL (ref 78.0–100.0)
PLATELETS: 143 10*3/uL — AB (ref 150–400)
RBC: 3.68 MIL/uL — AB (ref 3.87–5.11)
RDW: 15.1 % (ref 11.5–15.5)
WBC: 15.4 10*3/uL — ABNORMAL HIGH (ref 4.0–10.5)

## 2014-11-29 LAB — BASIC METABOLIC PANEL
Anion gap: 6 (ref 5–15)
BUN: 10 mg/dL (ref 6–20)
CHLORIDE: 107 mmol/L (ref 101–111)
CO2: 24 mmol/L (ref 22–32)
Calcium: 8.3 mg/dL — ABNORMAL LOW (ref 8.9–10.3)
Creatinine, Ser: 0.92 mg/dL (ref 0.44–1.00)
GFR calc Af Amer: >60 mL/min (ref >60)
GFR calc non Af Amer: >60 mL/min (ref >60)
Glucose, Bld: 102 mg/dL — ABNORMAL HIGH (ref 65–99)
POTASSIUM: 3.9 mmol/L (ref 3.5–5.1)
SODIUM: 137 mmol/L (ref 135–145)

## 2014-11-29 NOTE — Progress Notes (Signed)
1 Day Post-Op  Subjective: Alert.  Hemodynamically is stable.  Afebrile.  Heart rate 70.  Adequate urine output.  Denies nausea.  Pain control reasonable. Operative events discussed with patient and father in detail.  Objective: Vital signs in last 24 hours: Temp:  [97.9 F (36.6 C)-100 F (37.8 C)] 98.9 F (37.2 C) (07/06 0508) Pulse Rate:  [69-93] 70 (07/06 0508) Resp:  [13-16] 16 (07/06 0508) BP: (93-122)/(47-59) 105/53 mmHg (07/06 0508) SpO2:  [97 %-100 %] 97 % (07/06 0508) Last BM Date: 11/26/14  Intake/Output from previous day: 07/05 0701 - 07/06 0700 In: 3000 [I.V.:2850; IV Piggyback:150] Out: 1215 [Urine:945; Drains:145; Blood:125] Intake/Output this shift: Total I/O In: 500 [I.V.:500] Out: 330 [Urine:300; Drains:30]  General appearance: Alert.  Cooperative.  Skin warm and dry.  Mild distress.  Mental status normal Resp: clear to auscultation bilaterally GI: Abdomen soft.  Dressing dry.  Appropriately tender.  Drainage serosanguineous  Lab Results:  Results for orders placed or performed during the hospital encounter of 11/27/14 (from the past 24 hour(s))  Surgical pcr screen     Status: None   Collection Time: 11/28/14  7:51 AM  Result Value Ref Range   MRSA, PCR NEGATIVE NEGATIVE   Staphylococcus aureus NEGATIVE NEGATIVE  Gram stain     Status: None   Collection Time: 11/28/14 10:19 AM  Result Value Ref Range   Specimen Description FLUID PERITONEAL    Special Requests NONE    Gram Stain      ABUNDANT WBC PRESENT,BOTH PMN AND MONONUCLEAR NO ORGANISMS SEEN Performed at [facility]    Report Status 11/28/2014 FINAL   CBC     Status: Abnormal   Collection Time: 11/28/14  2:20 PM  Result Value Ref Range   WBC 20.2 (H) 4.0 - 10.5 K/uL   RBC 4.18 3.87 - 5.11 MIL/uL   Hemoglobin 12.1 12.0 - 15.0 g/dL   HCT 36.7 36.0 - 46.0 %   MCV 87.8 78.0 - 100.0 fL   MCH 28.9 26.0 - 34.0 pg   MCHC 33.0 30.0 - 36.0 g/dL   RDW 14.9 11.5 - 15.5 %   Platelets 131  (L) 150 - 400 K/uL  Creatinine, serum     Status: None   Collection Time: 11/28/14  2:20 PM  Result Value Ref Range   Creatinine, Ser 0.75 0.44 - 1.00 mg/dL   GFR calc non Af Amer >60 >60 mL/min   GFR calc Af Amer >60 >60 mL/min  Basic metabolic panel     Status: Abnormal   Collection Time: 11/29/14  4:32 AM  Result Value Ref Range   Sodium 137 135 - 145 mmol/L   Potassium 3.9 3.5 - 5.1 mmol/L   Chloride 107 101 - 111 mmol/L   CO2 24 22 - 32 mmol/L   Glucose, Bld 102 (H) 65 - 99 mg/dL   BUN 10 6 - 20 mg/dL   Creatinine, Ser 0.92 0.44 - 1.00 mg/dL   Calcium 8.3 (L) 8.9 - 10.3 mg/dL   GFR calc non Af Amer >60 >60 mL/min   GFR calc Af Amer >60 >60 mL/min   Anion gap 6 5 - 15  CBC     Status: Abnormal   Collection Time: 11/29/14  4:32 AM  Result Value Ref Range   WBC 15.4 (H) 4.0 - 10.5 K/uL   RBC 3.68 (L) 3.87 - 5.11 MIL/uL   Hemoglobin 11.1 (L) 12.0 - 15.0 g/dL   HCT 32.5 (L) 36.0 - 46.0 %  MCV 88.3 78.0 - 100.0 fL   MCH 30.2 26.0 - 34.0 pg   MCHC 34.2 30.0 - 36.0 g/dL   RDW 15.1 11.5 - 15.5 %   Platelets 143 (L) 150 - 400 K/uL     Studies/Results: No results found.  . heparin  5,000 Units Subcutaneous 3 times per day  . pantoprazole (PROTONIX) IV  40 mg Intravenous QHS  . piperacillin-tazobactam (ZOSYN)  IV  3.375 g Intravenous 3 times per day     Assessment/Plan: s/p Procedure(s): LAPAROSCOPY,LAPAROTOMY,APPENDECTOMY,RIGHT SALPINGECTOMY  POD #1.  Laparoscopy, laparotomy, appendectomy, right salpingectomy, abdominal lavage. Stable DC Foley Clear liquids Ambulate Begin twice a day dressing changes. Check cultures Continue Zosyn. Check pathology.  @PROBHOSP @  LOS: 1 day    Kdyn Vonbehren M 11/29/2014  . .prob

## 2014-11-29 NOTE — Clinical Documentation Improvement (Signed)
Abnormal Lab and/or Testing Results: hemoglobin values: 15.2, 12.5, 12.1, 11.1  What, if any, clinical significance can be associated with these hemoglobin values?  Thank you,  Carrolyn Meiers, [email or web address] [phone]

## 2014-11-30 LAB — BASIC METABOLIC PANEL
Anion gap: 9 (ref 5–15)
BUN: 8 mg/dL (ref 6–20)
CHLORIDE: 102 mmol/L (ref 101–111)
CO2: 23 mmol/L (ref 22–32)
Calcium: 8 mg/dL — ABNORMAL LOW (ref 8.9–10.3)
Creatinine, Ser: 0.95 mg/dL (ref 0.44–1.00)
GFR calc Af Amer: >60 mL/min (ref >60)
GFR calc non Af Amer: >60 mL/min (ref >60)
GLUCOSE: 84 mg/dL (ref 65–99)
Potassium: 3.6 mmol/L (ref 3.5–5.1)
Sodium: 134 mmol/L — ABNORMAL LOW (ref 135–145)

## 2014-11-30 LAB — CBC
HCT: 32.8 % — ABNORMAL LOW (ref 36.0–46.0)
HEMOGLOBIN: 10.8 g/dL — AB (ref 12.0–15.0)
MCH: 29 pg (ref 26.0–34.0)
MCHC: 32.9 g/dL (ref 30.0–36.0)
MCV: 87.9 fL (ref 78.0–100.0)
Platelets: 139 10*3/uL — ABNORMAL LOW (ref 150–400)
RBC: 3.73 MIL/uL — ABNORMAL LOW (ref 3.87–5.11)
RDW: 15 % (ref 11.5–15.5)
WBC: 14.7 10*3/uL — ABNORMAL HIGH (ref 4.0–10.5)

## 2014-11-30 MED ORDER — ENOXAPARIN SODIUM 40 MG/0.4ML ~~LOC~~ SOLN
40.0000 mg | SUBCUTANEOUS | Status: DC
  Administered 2014-11-30 – 2014-12-02 (×3): 40 mg via SUBCUTANEOUS
  Filled 2014-11-30 (×4): qty 0.4

## 2014-11-30 NOTE — Progress Notes (Signed)
[facility] called with  Result of body fluid sent on Tues.  Result was gram neg rods.  Reported this to Dr. Dalbert Batman and patient has zosyn and no new orders given.  Roland Rack.

## 2014-11-30 NOTE — Progress Notes (Signed)
2 Days Post-Op  Subjective: Ambulating in hall.  Tolerating clear liquid diet.  Would like to advance diet.  Denies shortness of breath.  Complains of incisional pain.  Voiding without difficulty.  Temp to 102 this morning.  Patient aware but no Reiger's or diaphoresis. Cultures show no growth at 24 hours Pathology shows acute appendicitis and benign right fallopian tube with serositis.  I explained the pathology report the patient her husband in detail.  Objective: Vital signs in last 24 hours: Temp:  [98.6 F (37 C)-102.2 F (39 C)] 99.1 F (37.3 C) (07/07 0535) Pulse Rate:  [66-90] 77 (07/07 0535) Resp:  [16] 16 (07/07 0535) BP: (98-107)/(46-61) 107/58 mmHg (07/07 0535) SpO2:  [97 %-100 %] 97 % (07/07 0535) Last BM Date: 11/26/14  Intake/Output from previous day: 07/06 0701 - 07/07 0700 In: 3320 [P.O.:1320; I.V.:2000] Out: 1150 [Urine:1100; Drains:50] Intake/Output this shift: Total I/O In: 1220 [P.O.:720; I.V.:500] Out: 700 [Urine:700]  General appearance: Alert.  Ambulating.  Minimal distress.  Skin warm and dry. Resp: clear bilat., no rhonchi or wheeze GI: soft but tender. Not distended.  Wound clean and packed open.  Active bowel sounds.  Drainage is thin, serosanguineous, odorless.  Lab Results:  No results found for this or any previous visit (from the past 24 hour(s)).   Studies/Results: No results found.  . heparin  5,000 Units Subcutaneous 3 times per day  . pantoprazole (PROTONIX) IV  40 mg Intravenous QHS  . piperacillin-tazobactam (ZOSYN)  IV  3.375 g Intravenous 3 times per day     Assessment/Plan: s/p Procedure(s): LAPAROSCOPY,LAPAROTOMY,APPENDECTOMY,RIGHT SALPINGECTOMY  POD #2. Laparoscopy, laparotomy, appendectomy, right salpingectomy, abdominal lavage. Recurrent acute appendicitis with secondary inflammation right fallopian tube. Stable Advance diet to full liquids. Ambulate  twice a day dressing changes. follow cultures Continue  Zosyn.  Fever.  Atelectasis likely.  Resolving peritonitis also possible. Expect this will be self-limited Encourage ambulation and incentive from a tree Check labs    @PROBHOSP @  LOS: 2 days    Katrina Deleon M 11/30/2014  . .prob

## 2014-12-01 MED ORDER — KETOROLAC TROMETHAMINE 30 MG/ML IJ SOLN: 30.0000 mg | Freq: Four times a day (QID) | INTRAMUSCULAR | Status: AC | PRN

## 2014-12-01 MED ORDER — MAGNESIUM HYDROXIDE 400 MG/5ML PO SUSP
30.0000 mL | Freq: Once | ORAL | Status: AC
  Administered 2014-12-01: 30 mL via ORAL
  Filled 2014-12-01: qty 30

## 2014-12-01 MED ORDER — SENNOSIDES-DOCUSATE SODIUM 8.6-50 MG PO TABS
1.0000 | ORAL_TABLET | Freq: Two times a day (BID) | ORAL | Status: DC
  Administered 2014-12-01 – 2014-12-02 (×3): 1 via ORAL
  Filled 2014-12-01 (×6): qty 1

## 2014-12-01 MED ORDER — HYDROCODONE-ACETAMINOPHEN 5-325 MG PO TABS
1.0000 | ORAL_TABLET | ORAL | Status: DC | PRN
  Administered 2014-12-01 – 2014-12-02 (×5): 2 via ORAL
  Filled 2014-12-01 (×5): qty 2

## 2014-12-01 MED ORDER — PANTOPRAZOLE SODIUM 40 MG PO TBEC
40.0000 mg | DELAYED_RELEASE_TABLET | Freq: Every day | ORAL | Status: DC
  Administered 2014-12-01 – 2014-12-02 (×2): 40 mg via ORAL
  Filled 2014-12-01 (×3): qty 1

## 2014-12-01 NOTE — Progress Notes (Signed)

## 2014-12-01 NOTE — Progress Notes (Signed)
Patient ID: Katrina Deleon, female   DOB: 1976/05/03, 39 y.o.   MRN: [id number]     [address] [place], [place] [address]    Phone: [phone] FAX: [phone]     Subjective: No n/v.  Pain controlled. No BM or flatus, tolerating fulls, but not taking much in. Objective:  Vital signs:  Filed Vitals:   11/30/14 1031 11/30/14 1409 11/30/14 2139 12/01/14 0550  BP:  112/64 109/61 108/61  Pulse:  74 69 68  Temp: 98.1 F (36.7 C) 99.3 F (37.4 C) 99.5 F (37.5 C) 99.5 F (37.5 C)  TempSrc:  Oral Oral Oral  Resp:  '16 16 16  ' Height:      Weight:      SpO2:  100% 100% 100%    Last BM Date: 11/26/14  Intake/Output   Yesterday:  07/07 0701 - 07/08 0700 In: 3180.8 [P.O.:960; I.V.:2220.8] Out: 1900 [Urine:1900] This shift: I/O last 3 completed shifts: In: 5690.8 [P.O.:1920; I.V.:3720.8; IV Piggyback:50] Out: 9012 [Urine:3100; Drains:40] Total I/O In: -  Out: 360 [Urine:350; Drains:10]   Physical Exam: General: Pt awake/alert/oriented x4 in no acute distress Chest: cta.  No chest wall pain w good excursion CV:  Pulses intact.  Regular rhythm MS: Normal AROM mjr joints.  No obvious deformity Abdomen: Soft.  Nondistended.  Mildly tender at incisions only.  Lap sites with dermabond.  Midline wound is open, beefy red, fascia is intact.  Drain with serosanguinous output. No evidence of peritonitis.  No incarcerated hernias. Ext:  SCDs BLE.  No mjr edema.  No cyanosis Skin: No petechiae / purpura   Problem List:   Principal Problem:   Ruptured appendicitis Active Problems:   Abdominal pain   Appendicitis with peritonitis    Results:   Labs: Results for orders placed or performed during the hospital encounter of 11/27/14 (from the past 48 hour(s))  CBC     Status: Abnormal   Collection Time: 11/30/14  6:20 AM  Result Value Ref Range   WBC 14.7 (H) 4.0 - 10.5 K/uL   RBC 3.73 (L) 3.87 - 5.11  MIL/uL   Hemoglobin 10.8 (L) 12.0 - 15.0 g/dL   HCT 32.8 (L) 36.0 - 46.0 %   MCV 87.9 78.0 - 100.0 fL   MCH 29.0 26.0 - 34.0 pg   MCHC 32.9 30.0 - 36.0 g/dL   RDW 15.0 11.5 - 15.5 %   Platelets 139 (L) 150 - 400 K/uL  Basic metabolic panel     Status: Abnormal   Collection Time: 11/30/14  6:20 AM  Result Value Ref Range   Sodium 134 (L) 135 - 145 mmol/L   Potassium 3.6 3.5 - 5.1 mmol/L   Chloride 102 101 - 111 mmol/L   CO2 23 22 - 32 mmol/L   Glucose, Bld 84 65 - 99 mg/dL   BUN 8 6 - 20 mg/dL   Creatinine, Ser 0.95 0.44 - 1.00 mg/dL   Calcium 8.0 (L) 8.9 - 10.3 mg/dL   GFR calc non Af Amer >60 >60 mL/min   GFR calc Af Amer >60 >60 mL/min    Comment: (NOTE) The eGFR has been calculated using the CKD EPI equation. This calculation has not been validated in all clinical situations. eGFR's persistently <60 mL/min signify possible Chronic Kidney Disease.    Anion gap 9 5 - 15    Imaging / Studies: No results found.  Medications / Allergies:  Scheduled Meds: . enoxaparin (LOVENOX) injection  40 mg Subcutaneous Q24H  . pantoprazole (PROTONIX) IV  40 mg Intravenous QHS  . piperacillin-tazobactam (ZOSYN)  IV  3.375 g Intravenous 3 times per day  . senna-docusate  1 tablet Oral BID   Continuous Infusions: . 0.9 % NaCl with KCl 20 mEq / L 125 mL/hr at 12/01/14 0527   PRN Meds:.diphenhydrAMINE **OR** diphenhydrAMINE, HYDROmorphone (DILAUDID) injection, menthol-cetylpyridinium, ondansetron **OR** ondansetron (ZOFRAN) IV, oxyCODONE-acetaminophen  Antibiotics: Anti-infectives    Start     Dose/Rate Route Frequency Ordered Stop   11/27/14 1400  piperacillin-tazobactam (ZOSYN) IVPB 3.375 g     3.375 g 12.5 mL/hr over 240 Minutes Intravenous 3 times per day 11/27/14 1339          Assessment/Plan POD#3 Laparoscopy, laparotomy, appendectomy, right salpingectomy, abdominal lavage---Dr. Dalbert Batman  Recurrent acute appendicitis with secondary inflammation right fallopian tube -stable.   -IS, ambulate, pain control -BID wet to dry dressing changes -continue drain ID-zosyn for appendicitis D#4, GNR on cultures, will follow.  Temp max 99.5. VTE prophylaxis-SCD/lovenox FEN-does not like anything on fulls, will trial soft diet and start senna-colace.  Reduce IVF to 57m/hr.  Labs in AM.  Dispo-continue IP  EErby Pian ANorthern [facility] HospitalSurgery Pager [phone](7A-4:30P)   12/01/2014 9:52 AM

## 2014-12-02 LAB — BASIC METABOLIC PANEL
ANION GAP: 6 (ref 5–15)
BUN: <5 mg/dL — ABNORMAL LOW (ref 6–20)
CHLORIDE: 105 mmol/L (ref 101–111)
CO2: 26 mmol/L (ref 22–32)
CREATININE: 0.72 mg/dL (ref 0.44–1.00)
Calcium: 7.9 mg/dL — ABNORMAL LOW (ref 8.9–10.3)
GFR calc non Af Amer: >60 mL/min (ref >60)
Glucose, Bld: 98 mg/dL (ref 65–99)
Potassium: 3.6 mmol/L (ref 3.5–5.1)
SODIUM: 137 mmol/L (ref 135–145)

## 2014-12-02 LAB — CBC
HCT: 28.4 % — ABNORMAL LOW (ref 36.0–46.0)
Hemoglobin: 9.6 g/dL — ABNORMAL LOW (ref 12.0–15.0)
MCH: 29.9 pg (ref 26.0–34.0)
MCHC: 33.8 g/dL (ref 30.0–36.0)
MCV: 88.5 fL (ref 78.0–100.0)
Platelets: 151 10*3/uL (ref 150–400)
RBC: 3.21 MIL/uL — ABNORMAL LOW (ref 3.87–5.11)
RDW: 14.9 % (ref 11.5–15.5)
WBC: 4.6 10*3/uL (ref 4.0–10.5)

## 2014-12-02 MED ORDER — AMOXICILLIN-POT CLAVULANATE 875-125 MG PO TABS
1.0000 | ORAL_TABLET | Freq: Two times a day (BID) | ORAL | Status: DC
  Administered 2014-12-02 – 2014-12-03 (×3): 1 via ORAL
  Filled 2014-12-02 (×4): qty 1

## 2014-12-02 NOTE — Progress Notes (Addendum)
Patient ID: Katrina Deleon, female   DOB: 1975-10-28, 39 y.o.   MRN: [id number]     [address] [place], [place] [address]    Phone: [phone] FAX: [phone]     Subjective: No n/v.  Pain controlled.  Nausea better Pos flatus, no BM, tolerating fulls  Objective:  Vital signs:  Filed Vitals:   12/01/14 0550 12/01/14 1347 12/01/14 2140 12/02/14 0620  BP: 108/61 125/72 109/65 98/59  Pulse: 68 70 66 66  Temp: 99.5 F (37.5 C)  99.1 F (37.3 C) 99 F (37.2 C)  TempSrc: Oral  Oral Oral  Resp: '16 18 18 18  ' Height:      Weight:      SpO2: 100% 100% 98% 96%    Last BM Date: 11/26/14  Intake/Output   Yesterday:  07/08 0701 - 07/09 0700 In: 7026 [P.O.:840; I.V.:900] Out: 2960 [Urine:2950; Drains:10] This shift: I/O last 3 completed shifts: In: 4080 [P.O.:1680; I.V.:2400] Out: 4460 [Urine:4450; Drains:10]     Physical Exam: General: Pt awake/alert/oriented x4 in no acute distress Chest: cta.  No chest wall pain w good excursion CV:  Pulses intact.  Regular rhythm MS: Normal AROM mjr joints.  No obvious deformity Abdomen: Soft.  Nondistended.  Mildly tender at incisions only.  Lap sites with dermabond.  Midline wound is open, beefy red, fascia is intact.  Drain with serosanguinous output. No evidence of peritonitis.  No incarcerated hernias. Ext:  SCDs BLE.  No mjr edema.  No cyanosis Skin: No petechiae / purpura   Problem List:   Principal Problem:   Ruptured appendicitis Active Problems:   Abdominal pain   Appendicitis with peritonitis    Results:   Labs: Results for orders placed or performed during the hospital encounter of 11/27/14 (from the past 48 hour(s))  CBC     Status: Abnormal   Collection Time: 12/02/14  5:10 AM  Result Value Ref Range   WBC 4.6 4.0 - 10.5 K/uL   RBC 3.21 (L) 3.87 - 5.11 MIL/uL   Hemoglobin 9.6 (L) 12.0 - 15.0 g/dL   HCT 28.4 (L) 36.0 - 46.0 %   MCV  88.5 78.0 - 100.0 fL   MCH 29.9 26.0 - 34.0 pg   MCHC 33.8 30.0 - 36.0 g/dL   RDW 14.9 11.5 - 15.5 %   Platelets 151 150 - 400 K/uL  Basic metabolic panel     Status: Abnormal   Collection Time: 12/02/14  5:10 AM  Result Value Ref Range   Sodium 137 135 - 145 mmol/L   Potassium 3.6 3.5 - 5.1 mmol/L   Chloride 105 101 - 111 mmol/L   CO2 26 22 - 32 mmol/L   Glucose, Bld 98 65 - 99 mg/dL   BUN <5 (L) 6 - 20 mg/dL   Creatinine, Ser 0.72 0.44 - 1.00 mg/dL   Calcium 7.9 (L) 8.9 - 10.3 mg/dL   GFR calc non Af Amer >60 >60 mL/min   GFR calc Af Amer >60 >60 mL/min    Comment: (NOTE) The eGFR has been calculated using the CKD EPI equation. This calculation has not been validated in all clinical situations. eGFR's persistently <60 mL/min signify possible Chronic Kidney Disease.    Anion gap 6 5 - 15    Imaging / Studies: No results found.  Medications / Allergies:  Scheduled Meds: . amoxicillin-clavulanate  1 tablet Oral Q12H  . enoxaparin (LOVENOX)  injection  40 mg Subcutaneous Q24H  . pantoprazole  40 mg Oral QHS  . senna-docusate  1 tablet Oral BID   Continuous Infusions: . 0.9 % NaCl with KCl 20 mEq / L 75 mL/hr at 12/02/14 0411   PRN Meds:.diphenhydrAMINE **OR** diphenhydrAMINE, HYDROcodone-acetaminophen, HYDROmorphone (DILAUDID) injection, ketorolac, menthol-cetylpyridinium, ondansetron **OR** ondansetron (ZOFRAN) IV  Antibiotics: Anti-infectives    Start     Dose/Rate Route Frequency Ordered Stop   12/02/14 1000  amoxicillin-clavulanate (AUGMENTIN) 875-125 MG per tablet 1 tablet     1 tablet Oral Every 12 hours 12/02/14 0933     11/27/14 1400  piperacillin-tazobactam (ZOSYN) IVPB 3.375 g  Status:  Discontinued     3.375 g 12.5 mL/hr over 240 Minutes Intravenous 3 times per day 11/27/14 1339 12/02/14 0933        Assessment/Plan POD#4 Laparoscopy, laparotomy, appendectomy, right salpingectomy, abdominal lavage---Dr. Dalbert Batman  Recurrent acute appendicitis with  secondary inflammation right fallopian tube -stable.  -IS, ambulate, pain control -BID wet to dry dressing changes -continue drain ID-zosyn for appendicitis D#5, GNR on cultures, will follow. Will switch to Augmentin.   Temp max 99.5. VTE prophylaxis-SCD/lovenox FEN- cont soft diet.  Reduce IVF to 56m/hr.  Dispo-continue IP Anemia: combination of acute blood loss during surgery and dilution, no signs of active hemorrhage, will follow  ARosario Adie MD  [facility]   12/02/2014 9:34 AM

## 2014-12-02 NOTE — Progress Notes (Signed)
CRITICAL VALUE ALERT   Critical value received:  Positive Blood  Cultures  Gram  Neg Rods  Date of notification:  12/02/2014   Time of notification:  5:48 AM     Critical value read back:Yes.    Nurse who received alert:  Jeanie Sewer   MD notified (1st page):   Time of first page:   MD notified (2nd page):  Time of second page:  Responding MD:   Time MD responded:

## 2014-12-03 DIAGNOSIS — N7011 Chronic salpingitis: Secondary | ICD-10-CM

## 2014-12-03 LAB — CBC
HCT: 30 % — ABNORMAL LOW (ref 36.0–46.0)
HEMOGLOBIN: 10.2 g/dL — AB (ref 12.0–15.0)
MCH: 29.9 pg (ref 26.0–34.0)
MCHC: 34 g/dL (ref 30.0–36.0)
MCV: 88 fL (ref 78.0–100.0)
Platelets: 218 10*3/uL (ref 150–400)
RBC: 3.41 MIL/uL — AB (ref 3.87–5.11)
RDW: 14.6 % (ref 11.5–15.5)
WBC: 6.1 10*3/uL (ref 4.0–10.5)

## 2014-12-03 MED ORDER — HYDROCODONE-ACETAMINOPHEN 5-325 MG PO TABS: 1.0000 | ORAL_TABLET | ORAL | Status: DC | PRN

## 2014-12-03 MED ORDER — AMOXICILLIN-POT CLAVULANATE 875-125 MG PO TABS: 1.0000 | ORAL_TABLET | Freq: Two times a day (BID) | ORAL | Status: DC

## 2014-12-03 NOTE — Discharge Instructions (Signed)
ABDOMINAL SURGERY: POST OP INSTRUCTIONS ° °1. DIET: Follow a light bland diet the first 24 hours after arrival home, such as soup, liquids, crackers, etc.  Be sure to include lots of fluids daily.  Avoid fast food or heavy meals as your are more likely to get nauseated.  Eat a low fat the next few days after surgery.   °2. Take your usually prescribed home medications unless otherwise directed. °3. PAIN CONTROL: °a. Pain is best controlled by a usual combination of three different methods TOGETHER: °i. Ice/Heat °ii. Over the counter pain medication °iii. Prescription pain medication °b. Most patients will experience some swelling and bruising around the incisions.  Ice packs or heating pads (30-60 minutes up to 6 times a day) will help. Use ice for the first few days to help decrease swelling and bruising, then switch to heat to help relax tight/sore spots and speed recovery.  Some people prefer to use ice alone, heat alone, alternating between ice & heat.  Experiment to what works for you.  Swelling and bruising can take several weeks to resolve.   °c. It is helpful to take an over-the-counter pain medication regularly for the first few weeks.  Choose one of the following that works best for you: °i. Naproxen (Aleve, etc)  Two 220mg tabs twice a day °ii. Ibuprofen (Advil, etc) Three 200mg tabs four times a day (every meal & bedtime) °iii. Acetaminophen (Tylenol, etc) 500-650mg four times a day (every meal & bedtime) °d. A  prescription for pain medication (such as oxycodone, hydrocodone, etc) should be given to you upon discharge.  Take your pain medication as prescribed.  °i. If you are having problems/concerns with the prescription medicine (does not control pain, nausea, vomiting, rash, itching, etc), please call us [phone] to see if we need to switch you to a different pain medicine that will work better for you and/or control your side effect better. °ii. If you need a refill on your pain medication,  please contact your pharmacy.  They will contact our office to request authorization. Prescriptions will not be filled after 5 pm or on week-ends. °4. Avoid getting constipated.  Between the surgery and the pain medications, it is common to experience some constipation.  Increasing fluid intake and taking a fiber supplement (such as Metamucil, Citrucel, FiberCon, MiraLax, etc) 1-2 times a day regularly will usually help prevent this problem from occurring.  A mild laxative (prune juice, Milk of Magnesia, MiraLax, etc) should be taken according to package directions if there are no bowel movements after 48 hours.   °5. Watch out for diarrhea.  If you have many loose bowel movements, simplify your diet to bland foods & liquids for a few days.  Stop any stool softeners and decrease your fiber supplement.  Switching to mild anti-diarrheal medications (Kayopectate, Pepto Bismol) can help.  If this worsens or does not improve, please call us. °6. Wash / shower every day.  You may shower over the incision / wound.  Avoid baths until the skin is fully healed.  Continue to shower over incision(s) after the dressing is off. °7. Remove your waterproof bandages 5 days after surgery.  You may leave the incision open to air.  You may replace a dressing/Band-Aid to cover the incision for comfort if you wish. °8. ACTIVITIES as tolerated:   °a. You may resume regular (light) daily activities beginning the next day--such as daily self-care, walking, climbing stairs--gradually increasing activities as tolerated.  If you can   walk 30 minutes without difficulty, it is safe to try more intense activity such as jogging, treadmill, bicycling, low-impact aerobics, swimming, etc. b. Save the most intensive and strenuous activity for last such as sit-ups, heavy lifting, contact sports, etc  Refrain from any heavy lifting or straining until you are off narcotics for pain control.   c. DO NOT PUSH THROUGH PAIN.  Let pain be your guide: If it  hurts to do something, don't do it.  Pain is your body warning you to avoid that activity for another week until the pain goes down. d. You may drive when you are no longer taking prescription pain medication, you can comfortably wear a seatbelt, and you can safely maneuver your car and apply brakes. e. Dennis Bast may have sexual intercourse when it is comfortable.  9. FOLLOW UP in our office a. Please call [facility] at [phone] to set up an appointment to see your surgeon in the office for a follow-up appointment approximately 1-2 weeks after your surgery. b. Make sure that you call for this appointment the day you arrive home to insure a convenient appointment time. 10. IF YOU HAVE DISABILITY OR FAMILY LEAVE FORMS, BRING THEM TO THE OFFICE FOR PROCESSING.  DO NOT GIVE THEM TO YOUR DOCTOR.   WHEN TO CALL us [phone]: 1. Poor pain control 2. Reactions / problems with new medications (rash/itching, nausea, etc)  3. Fever over 101.5 F (38.5 C) 4. Inability to urinate 5. Nausea and/or vomiting 6. Worsening swelling or bruising 7. Continued bleeding from incision. 8. Increased pain, redness, or drainage from the incision  The clinic staff is available to answer your questions during regular business hours (8:30am-5pm).  Please dont hesitate to call and ask to speak to one of our nurses for clinical concerns.   A surgeon from [facility] is always on call at the hospitals   If you have a medical emergency, go to the nearest emergency room or call 911.    [facility], [place], [place], [place], [place]  [address] ? MAIN: [phone] ? TOLL FREE: [phone] ? FAX [phone] [email or web address]  Dressing Change  A dressing is a material placed over wounds. It keeps the wound clean, dry, and protected from further injury. This provides an environment that favors wound healing. This should be done at least once a day, and twice a  day is preferable for the first couple weeks.  BEFORE YOU BEGIN  Get your supplies together. Things you may need include:  Saline solution.  Flexible gauze dressing.  Tape.  Gloves.  Abdominal dressing pads.  Gauze squares.  Plastic bags. Take pain medicine 30 minutes before the dressing change if you need it.  Take a shower or sponge bath before you do the first dressing change of the day.  REMOVING YOUR OLD DRESSING  Wash your hands with soap and water. Dry your hands with a clean towel.  Put on your gloves.  Remove any tape.  Carefully remove the old dressing. If the dressing sticks, you may dampen it with warm water to loosen it, or follow your caregiver's specific directions.  Remove any gauze or packing tape that is in your wound. .  Put the gloves, tape, gauze, or any packing tape into a plastic bag. CHANGING YOUR DRESSING  Open the supplies.  Take the cap off the saline solution.  Open the gauze package.  Clean your wound with warm soapy water and rinse with  saline or water Your caregiver may tell you to do one or more of the following:  Pick up the gauze. Pour the saline solution over the gauze. Squeeze out the extra saline solution.   Put the solution soaked gauze only in your wound, not on the skin around it.  Pack your wound loosely or as told by your caregiver.  Put dry gauze on your wound.  Put abdominal dressing pads over the dry gauze if your wet gauze soaks through. Tape the abdominal dressing pads in place so they will not fall off. Do not wrap the tape completely around the affected part (arm, leg, abdomen).  Wrap the dressing pads with a flexible gauze dressing to secure it in place.  Take off your gloves. Put them in the plastic bag with the old dressing. Tie the bag shut and throw it away.  Keep the dressing clean and dry until your next dressing change.  Wash your hands. SEEK MEDICAL CARE IF:  Your skin around the wound looks very red.  Your wound feels  more tender or sore.  You see pus in the wound.  Your wound smells bad.  You have a fever.  Your skin around the wound has a rash that itches and burns.  You see black or yellow skin in your wound that was not there before.  You feel nauseous, throw up, and feel very tired.  Document Released: 06/19/2004 Document Revised: 08/04/2011 Document Reviewed: 03/24/2011  [facility] Patient Information 2015 [employer], [place]. This information is not intended to replace advice given to you by your health care provider. Make sure you discuss any questions you have with your health care provider.

## 2014-12-03 NOTE — Discharge Summary (Signed)
  Patient ID: Katrina Deleon [id number] 39 y.o. [birth year]  11/27/2014  Discharge date and time: No discharge date for patient encounter.  Admitting Physician: Fanny Skates, MD  Discharge Physician: Rosario Adie  Admission Diagnoses: Right lower quadrant abdominal pain [R10.31]  Discharge Diagnoses: Appendictis  Operations: Procedure(s): LAPAROSCOPY CONVERTED TO LAPAROTOMY,APPENDECTOMY,RIGHT SALPINGECTOMY    Discharged Condition: good    Hospital Course: Patient was admitted with recurrent RLQ abscess.  She was taken to the OR where a laparoscopic evaluation was performed.  This appeared to be appendicitis.  The surgery was converted to open and appendectomy was performed.  The wound was left open and packed.  She was admitted to the floor.  Her diet was advanced as her ileus resolved.  By POD 5 she was tolerating a diet and having bowel function.  Her pain was controlled and her wbc was normal.  She was felt to be in stable condition for d/c home.  She will continue her wound packing at home with home health RN.    Consults: None  Significant Diagnostic Studies: labs: cbc, chemistry  Treatments: IV hydration, antibiotics: Zosyn and Augmentin and surgery: open appendectomy  Disposition: Home

## 2014-12-03 NOTE — Care Management Note (Signed)
Case Management Note  Patient Details  Name: Roben Tatsch MRN: [id number] Date of Birth: 1976/05/18  Subjective/Objective:       appendectomy             Action/Plan: Home Health  Expected Discharge Date:  12/03/2014               Expected Discharge Plan:  [place]  In-House Referral:     Discharge planning Services  CM Consult  Post Acute Care Choice:  Home Health Choice offered to:  Patient  DME Arranged:    DME Agency:     HH Arranged:  RN [place] Agency:  [facility]  Status of Service:  Completed, signed off  Medicare Important Message Given:    Date Medicare IM Given:    Medicare IM give by:    Date Additional Medicare IM Given:    Additional Medicare Important Message give by:     If discussed at [place] of Stay Meetings, dates discussed:    Additional Comments: Consult for Home Health. NCM spoke to pt and offered choice for [facility]. Pt agreeable to [facility] for HH. Contacted [facility] for [facility] RN with soc 24-48 hours. Pt has wound requiring daily dressing changes. Unit RN will provide supplies to do dressing changes for 1-2 days.  Erenest Rasher, RN 12/03/2014, 10:46 AM

## 2014-12-03 NOTE — Progress Notes (Signed)
Discharge instructions discussed with pt and her husband. Husband changed dressing correctly with minimal instruction. Supplies given for home use.

## 2014-12-05 LAB — CULTURE, BODY FLUID W GRAM STAIN -BOTTLE

## 2014-12-05 LAB — CULTURE, BODY FLUID-BOTTLE

## 2014-12-19 ENCOUNTER — Encounter (HOSPITAL_COMMUNITY): Payer: Self-pay | Admitting: Emergency Medicine

## 2014-12-19 ENCOUNTER — Emergency Department (HOSPITAL_COMMUNITY): Payer: Managed Care, Other (non HMO)

## 2014-12-19 ENCOUNTER — Inpatient Hospital Stay (HOSPITAL_COMMUNITY)
Admission: EM | Admit: 2014-12-19 | Discharge: 2014-12-25 | DRG: 862 | Disposition: A | Discharge status: Home / Self Care | Payer: Managed Care, Other (non HMO) | Attending: General Surgery | Admitting: General Surgery

## 2014-12-19 DIAGNOSIS — Z79899 Other long term (current) drug therapy: Secondary | ICD-10-CM | POA: Diagnosis not present

## 2014-12-19 DIAGNOSIS — E46 Unspecified protein-calorie malnutrition: Secondary | ICD-10-CM | POA: Diagnosis present

## 2014-12-19 DIAGNOSIS — E876 Hypokalemia: Secondary | ICD-10-CM | POA: Diagnosis present

## 2014-12-19 DIAGNOSIS — K651 Peritoneal abscess: Secondary | ICD-10-CM | POA: Diagnosis present

## 2014-12-19 DIAGNOSIS — Z791 Long term (current) use of non-steroidal anti-inflammatories (NSAID): Secondary | ICD-10-CM

## 2014-12-19 DIAGNOSIS — Z803 Family history of malignant neoplasm of breast: Secondary | ICD-10-CM | POA: Diagnosis not present

## 2014-12-19 DIAGNOSIS — Z8049 Family history of malignant neoplasm of other genital organs: Secondary | ICD-10-CM

## 2014-12-19 DIAGNOSIS — N739 Female pelvic inflammatory disease, unspecified: Secondary | ICD-10-CM | POA: Insufficient documentation

## 2014-12-19 DIAGNOSIS — Z6820 Body mass index (BMI) 20.0-20.9, adult: Secondary | ICD-10-CM

## 2014-12-19 DIAGNOSIS — L0291 Cutaneous abscess, unspecified: Secondary | ICD-10-CM

## 2014-12-19 DIAGNOSIS — N7092 Oophoritis, unspecified: Secondary | ICD-10-CM

## 2014-12-19 DIAGNOSIS — Z91018 Allergy to other foods: Secondary | ICD-10-CM | POA: Diagnosis not present

## 2014-12-19 DIAGNOSIS — T814XXA Infection following a procedure, initial encounter: Principal | ICD-10-CM | POA: Diagnosis present

## 2014-12-19 DIAGNOSIS — R1032 Left lower quadrant pain: Secondary | ICD-10-CM | POA: Diagnosis present

## 2014-12-19 DIAGNOSIS — Y838 Other surgical procedures as the cause of abnormal reaction of the patient, or of later complication, without mention of misadventure at the time of the procedure: Secondary | ICD-10-CM | POA: Diagnosis present

## 2014-12-19 LAB — URINALYSIS, ROUTINE W REFLEX MICROSCOPIC
BILIRUBIN URINE: NEGATIVE
Glucose, UA: NEGATIVE mg/dL
Hgb urine dipstick: NEGATIVE
Ketones, ur: 15 mg/dL — AB
Leukocytes, UA: NEGATIVE
NITRITE: NEGATIVE
Protein, ur: NEGATIVE mg/dL
Specific Gravity, Urine: 1.019 (ref 1.005–1.030)
UROBILINOGEN UA: 0.2 mg/dL (ref 0.0–1.0)
pH: 6 (ref 5.0–8.0)

## 2014-12-19 LAB — COMPREHENSIVE METABOLIC PANEL
ALK PHOS: 60 U/L (ref 38–126)
ALT: 28 U/L (ref 14–54)
AST: 17 U/L (ref 15–41)
Albumin: 3.5 g/dL (ref 3.5–5.0)
Anion gap: 9 (ref 5–15)
BUN: 11 mg/dL (ref 6–20)
CALCIUM: 8.9 mg/dL (ref 8.9–10.3)
CO2: 24 mmol/L (ref 22–32)
Chloride: 103 mmol/L (ref 101–111)
Creatinine, Ser: 0.82 mg/dL (ref 0.44–1.00)
GFR calc Af Amer: >60 mL/min (ref >60)
GFR calc non Af Amer: >60 mL/min (ref >60)
Glucose, Bld: 95 mg/dL (ref 65–99)
Potassium: 3.4 mmol/L — ABNORMAL LOW (ref 3.5–5.1)
Sodium: 136 mmol/L (ref 135–145)
TOTAL PROTEIN: 7.6 g/dL (ref 6.5–8.1)
Total Bilirubin: 0.8 mg/dL (ref 0.3–1.2)

## 2014-12-19 LAB — CBC WITH DIFFERENTIAL/PLATELET
Basophils Absolute: 0 10*3/uL (ref 0.0–0.1)
Basophils Relative: 0 % (ref 0–1)
EOS ABS: 0.1 10*3/uL (ref 0.0–0.7)
EOS PCT: 1 % (ref 0–5)
HEMATOCRIT: 35.1 % — AB (ref 36.0–46.0)
Hemoglobin: 11.7 g/dL — ABNORMAL LOW (ref 12.0–15.0)
Lymphocytes Relative: 10 % — ABNORMAL LOW (ref 12–46)
Lymphs Abs: 1.6 10*3/uL (ref 0.7–4.0)
MCH: 30 pg (ref 26.0–34.0)
MCHC: 33.3 g/dL (ref 30.0–36.0)
MCV: 90 fL (ref 78.0–100.0)
MONO ABS: 1.3 10*3/uL — AB (ref 0.1–1.0)
MONOS PCT: 8 % (ref 3–12)
NEUTROS PCT: 81 % — AB (ref 43–77)
Neutro Abs: 13.8 10*3/uL — ABNORMAL HIGH (ref 1.7–7.7)
PLATELETS: 278 10*3/uL (ref 150–400)
RBC: 3.9 MIL/uL (ref 3.87–5.11)
RDW: 14.9 % (ref 11.5–15.5)
WBC: 16.8 10*3/uL — ABNORMAL HIGH (ref 4.0–10.5)

## 2014-12-19 LAB — I-STAT BETA HCG BLOOD, ED (MC, WL, AP ONLY)

## 2014-12-19 LAB — LIPASE, BLOOD: LIPASE: 25 U/L (ref 22–51)

## 2014-12-19 MED ORDER — HEPARIN SODIUM (PORCINE) 5000 UNIT/ML IJ SOLN
5000.0000 [IU] | Freq: Three times a day (TID) | INTRAMUSCULAR | Status: DC
  Administered 2014-12-20 – 2014-12-23 (×6): 5000 [IU] via SUBCUTANEOUS
  Filled 2014-12-19 (×17): qty 1

## 2014-12-19 MED ORDER — PIPERACILLIN-TAZOBACTAM 3.375 G IVPB
3.3750 g | Freq: Three times a day (TID) | INTRAVENOUS | Status: DC
  Administered 2014-12-20 – 2014-12-25 (×17): 3.375 g via INTRAVENOUS
  Filled 2014-12-19 (×19): qty 50

## 2014-12-19 MED ORDER — ACETAMINOPHEN 325 MG PO TABS
650.0000 mg | ORAL_TABLET | Freq: Four times a day (QID) | ORAL | Status: DC | PRN
  Administered 2014-12-19 – 2014-12-23 (×7): 650 mg via ORAL
  Filled 2014-12-19 (×6): qty 2

## 2014-12-19 MED ORDER — MORPHINE SULFATE 2 MG/ML IJ SOLN
1.0000 mg | INTRAMUSCULAR | Status: DC | PRN
  Administered 2014-12-22: 2 mg via INTRAVENOUS
  Administered 2014-12-22: 1 mg via INTRAVENOUS
  Administered 2014-12-22: 2 mg via INTRAVENOUS
  Administered 2014-12-22: 1 mg via INTRAVENOUS
  Administered 2014-12-22 – 2014-12-23 (×2): 2 mg via INTRAVENOUS
  Filled 2014-12-19 (×6): qty 1

## 2014-12-19 MED ORDER — ACETAMINOPHEN 650 MG RE SUPP: 650.0000 mg | Freq: Four times a day (QID) | RECTAL | Status: DC | PRN

## 2014-12-19 MED ORDER — IOHEXOL 300 MG/ML  SOLN
50.0000 mL | Freq: Once | INTRAMUSCULAR | Status: AC | PRN
  Administered 2014-12-19: 50 mL via ORAL

## 2014-12-19 MED ORDER — DIPHENHYDRAMINE HCL 50 MG/ML IJ SOLN: 12.5000 mg | Freq: Four times a day (QID) | INTRAMUSCULAR | Status: DC | PRN

## 2014-12-19 MED ORDER — KCL IN DEXTROSE-NACL 20-5-0.9 MEQ/L-%-% IV SOLN
INTRAVENOUS | Status: DC
  Administered 2014-12-19 – 2014-12-25 (×12): via INTRAVENOUS
  Filled 2014-12-19 (×17): qty 1000

## 2014-12-19 MED ORDER — IOHEXOL 300 MG/ML  SOLN
100.0000 mL | Freq: Once | INTRAMUSCULAR | Status: AC | PRN
  Administered 2014-12-19: 100 mL via INTRAVENOUS

## 2014-12-19 MED ORDER — OXYCODONE HCL 5 MG PO TABS: 5.0000 mg | ORAL_TABLET | ORAL | Status: DC | PRN

## 2014-12-19 MED ORDER — ONDANSETRON HCL 4 MG/2ML IJ SOLN: 4.0000 mg | Freq: Four times a day (QID) | INTRAMUSCULAR | Status: DC | PRN

## 2014-12-19 MED ORDER — DIPHENHYDRAMINE HCL 12.5 MG/5ML PO ELIX: 12.5000 mg | ORAL_SOLUTION | Freq: Four times a day (QID) | ORAL | Status: DC | PRN

## 2014-12-19 MED ORDER — PIPERACILLIN-TAZOBACTAM 3.375 G IVPB 30 MIN
3.3750 g | INTRAVENOUS | Status: AC
  Administered 2014-12-19: 3.375 g via INTRAVENOUS
  Filled 2014-12-19: qty 50

## 2014-12-19 NOTE — Consult Note (Addendum)
Reason for Consult:Possible pelvic abscess 21 days postoperative laparoscopy, laparotomy, appendectomy and right salpingectomy by Dr Dalbert Batman Referring Physician: Dr Katrina Deleon is an 39 y.o. female. G0P0000 Patient's last menstrual period was 11/13/2014. Patient CT and op notes reviewed. 06/2014 she had laparoscopy and appendix was not seen, treated with antibiotics post op. 7/4 she presented with findings c/w appendicitis and suspected adjacent salpingitis, laparotomy was done with Dr Skeet Latch assisting, our service was not consulted. She saw Dr Gala Romney for routine visit 06/2012. Readmitted with low abdominal pain and fever for several days.   Pertinent Gynecological History: Menses: flow is moderate and regular every 28 days without intermenstrual spotting  Contraception: none  Blood transfusions: none Sexually transmitted diseases: no past history Previous GYN Procedures: right salpingectomy with appendectomy   Last pap: normal Date: 2014 OB History: G0, P0   Menstrual History:  Patient's last menstrual period was 11/13/2014.    Past Medical History  Diagnosis Date  . Fibroadenoma of breast 2003  . Ulcer   . Cancer     Past Surgical History  Procedure Laterality Date  . Breast excisional biopsy  2003    benign  . Laparoscopic appendectomy N/A 07/12/2014    Procedure: LAPAROSCOPIC EXPLORATION AND DRAINAGE OF WHITE PELVIC ABCESS WITH  INSERTION OF DRAIN;  Surgeon: Alphonsa Overall, MD;  Location: [facility];  Service: General;  Laterality: N/A;  . Laparoscopic abdominal exploration N/A 11/28/2014    Procedure: LAPAROSCOPY,LAPAROTOMY,APPENDECTOMY,RIGHT SALPINGECTOMY;  Surgeon: Fanny Skates, MD;  Location: [facility];  Service: General;  Laterality: N/A;    Family History  Problem Relation Age of Onset  . Breast cancer Mother   . Breast cancer Paternal Grandmother   . Uterine cancer Maternal Grandmother     Social History:  reports that she has never smoked. She has  never used smokeless tobacco. She reports that she drinks about 0.5 oz of alcohol per week. She reports that she does not use illicit drugs.  Allergies:  Allergies  Allergen Reactions  . Other     Walnuts, pecans     Medications: I have reviewed the patient's current medications.  ROS See HPI. No bleeding or vaginal discharge Blood pressure 105/50, pulse 71, temperature 98.7 F (37.1 C), temperature source Oral, resp. rate 16, last menstrual period 11/13/2014, SpO2 100 %. Physical Exam .NAD, pleasant Abdomen: ND, mildly tender no guarding, large abd dressing midline covering her surgical wound from 11/27/14, no mass Pelvic exam was deferred. Negative GC and CT on last admission Results for orders placed or performed during the hospital encounter of 12/19/14 (from the past 48 hour(s))  I-Stat Beta hCG blood, ED ([facility], [facility], [facility] only)     Status: None   Collection Time: 12/19/14  1:58 PM  Result Value Ref Range   I-stat hCG, quantitative <5.0 <5 mIU/mL   Comment 3            Comment:   GEST. AGE      CONC.  (mIU/mL)   <=1 WEEK        5 - 50     2 WEEKS       50 - 500     3 WEEKS       100 - 10,000     4 WEEKS     1,000 - 30,000        FEMALE AND NON-PREGNANT FEMALE:     LESS THAN 5 mIU/mL   Comprehensive metabolic panel     Status: Abnormal  Collection Time: 12/19/14  2:09 PM  Result Value Ref Range   Sodium 136 135 - 145 mmol/L   Potassium 3.4 (L) 3.5 - 5.1 mmol/L   Chloride 103 101 - 111 mmol/L   CO2 24 22 - 32 mmol/L   Glucose, Bld 95 65 - 99 mg/dL   BUN 11 6 - 20 mg/dL   Creatinine, Ser 0.82 0.44 - 1.00 mg/dL   Calcium 8.9 8.9 - 10.3 mg/dL   Total Protein 7.6 6.5 - 8.1 g/dL   Albumin 3.5 3.5 - 5.0 g/dL   AST 17 15 - 41 U/L   ALT 28 14 - 54 U/L   Alkaline Phosphatase 60 38 - 126 U/L   Total Bilirubin 0.8 0.3 - 1.2 mg/dL   GFR calc non Af Amer >60 >60 mL/min   GFR calc Af Amer >60 >60 mL/min    Comment: (NOTE) The eGFR has been calculated using the CKD EPI  equation. This calculation has not been validated in all clinical situations. eGFR's persistently <60 mL/min signify possible Chronic Kidney Disease.    Anion gap 9 5 - 15  CBC with Differential     Status: Abnormal   Collection Time: 12/19/14  2:09 PM  Result Value Ref Range   WBC 16.8 (H) 4.0 - 10.5 K/uL   RBC 3.90 3.87 - 5.11 MIL/uL   Hemoglobin 11.7 (L) 12.0 - 15.0 g/dL   HCT 35.1 (L) 36.0 - 46.0 %   MCV 90.0 78.0 - 100.0 fL   MCH 30.0 26.0 - 34.0 pg   MCHC 33.3 30.0 - 36.0 g/dL   RDW 14.9 11.5 - 15.5 %   Platelets 278 150 - 400 K/uL   Neutrophils Relative % 81 (H) 43 - 77 %   Neutro Abs 13.8 (H) 1.7 - 7.7 K/uL   Lymphocytes Relative 10 (L) 12 - 46 %   Lymphs Abs 1.6 0.7 - 4.0 K/uL   Monocytes Relative 8 3 - 12 %   Monocytes Absolute 1.3 (H) 0.1 - 1.0 K/uL   Eosinophils Relative 1 0 - 5 %   Eosinophils Absolute 0.1 0.0 - 0.7 K/uL   Basophils Relative 0 0 - 1 %   Basophils Absolute 0.0 0.0 - 0.1 K/uL  Lipase, blood     Status: None   Collection Time: 12/19/14  2:09 PM  Result Value Ref Range   Lipase 25 22 - 51 U/L  Urinalysis, Routine w reflex microscopic (not at [facility])     Status: Abnormal   Collection Time: 12/19/14  3:04 PM  Result Value Ref Range   Color, Urine AMBER (A) YELLOW    Comment: BIOCHEMICALS MAY BE AFFECTED BY COLOR   APPearance CLOUDY (A) CLEAR   Specific Gravity, Urine 1.019 1.005 - 1.030   pH 6.0 5.0 - 8.0   Glucose, UA NEGATIVE NEGATIVE mg/dL   Hgb urine dipstick NEGATIVE NEGATIVE   Bilirubin Urine NEGATIVE NEGATIVE   Ketones, ur 15 (A) NEGATIVE mg/dL   Protein, ur NEGATIVE NEGATIVE mg/dL   Urobilinogen, UA 0.2 0.0 - 1.0 mg/dL   Nitrite NEGATIVE NEGATIVE   Leukocytes, UA NEGATIVE NEGATIVE    Comment: MICROSCOPIC NOT DONE ON URINES WITH NEGATIVE PROTEIN, BLOOD, LEUKOCYTES, NITRITE, OR GLUCOSE <1000 mg/dL.    Ct Abdomen Pelvis W Contrast  12/19/2014   CLINICAL DATA:  Status post appendectomy with bilateral flank pain for 1 week  EXAM: CT ABDOMEN  AND PELVIS WITH CONTRAST  TECHNIQUE: Multidetector CT imaging of the abdomen and pelvis  was performed using the standard protocol following bolus administration of intravenous contrast.  CONTRAST:  192m OMNIPAQUE IOHEXOL 300 MG/ML  SOLN  COMPARISON:  11/27/2014  FINDINGS: The lung bases are free of acute infiltrate or sizable effusion. The liver, gallbladder, spleen, adrenal glands and pancreas are within normal limits. The kidneys are well visualized bilaterally and demonstrate a normal enhancement pattern. Normal excretion of contrast material is seen.  The appendix and right ovary have been removed. Uterus is deviated to the right and there is evidence of loculated appearing fluid along the lateral aspect of the uterus on the left best seen on image number 76 of series 2. Adjacent to this is a considerable amount of inflammatory change as well as multiloculated fluid collection which may be related to the left ovary although could be adjacent to the left ovary. This measures approximately 3.8 x 5.5 cm. To the right of the uterus best seen on image number 76 of series to there is a second fluid collection identified which may be related to the recent surgery. It measures approximately 3.8 by 1.8 cm. These changes may represent superinfection from the recent surgery and ruptured appendix although may be related to subsequent tubo-ovarian abscess on the left.  There remains significant inflammatory change of a loop of small bowel coursing anterior to this as has been present on multiple previous exams. The bladder is well distended. No bony abnormality is seen.  IMPRESSION: Changes in the pelvis consistent with significant inflammatory change and likely abscess formation on the left. It is uncertain whether this represents a tubo-ovarian abscess or some superinfection postoperatively. Surgical consultation is recommended.  These results were called by telephone at the time of interpretation on 12/19/2014 at 4:08 pm  to CZenovia Jarred MD, who verbally acknowledged these results.   Electronically Signed   By: MInez CatalinaM.D.   On: 12/19/2014 16:06    Assessment/Plan: Pelvic abscess recurrent in surgical patient 3 weeks postop laparotomy, doubt a primary ascending genital tract infection. We typically treat TOA with antibiotics and consider IR drainage if not responding. She already lost one fallopian tube would hope to preserve her left tube if possible so as to not sterilize her. We will follow with you if needed and would be willing to consult if she is taken to OR or Gyn onc as was done before. Thanks for the consult. 30 min face to face and coordination of care   Katrina Deleon 12/19/2014 6:54 PM

## 2014-12-19 NOTE — Progress Notes (Signed)
ANTIBIOTIC CONSULT NOTE - INITIAL  Pharmacy Consult for Zosyn Indication: Intra-abdominal infection  Allergies  Allergen Reactions  . Other     Walnuts, pecans     Patient Measurements:    Vital Signs: Temp: 98.7 F (37.1 C) (07/26 1348) Temp Source: Oral (07/26 1348) BP: 105/50 mmHg (07/26 1630) Pulse Rate: 71 (07/26 1630) Intake/Output from previous day:   Intake/Output from this shift:    Labs:  Recent Labs  12/19/14 1409  WBC 16.8*  HGB 11.7*  PLT 278  CREATININE 0.82   CrCl cannot be calculated (Unknown ideal weight.). No results for input(s): VANCOTROUGH, VANCOPEAK, VANCORANDOM, GENTTROUGH, GENTPEAK, GENTRANDOM, TOBRATROUGH, TOBRAPEAK, TOBRARND, AMIKACINPEAK, AMIKACINTROU, AMIKACIN in the last 72 hours.   Microbiology: Recent Results (from the past 720 hour(s))  Wet prep, genital     Status: Abnormal   Collection Time: 11/27/14  8:56 AM  Result Value Ref Range Status   Yeast Wet Prep HPF POC NONE SEEN NONE SEEN Final   Trich, Wet Prep NONE SEEN NONE SEEN Final   Clue Cells Wet Prep HPF POC NONE SEEN NONE SEEN Final   WBC, Wet Prep HPF POC FEW (A) NONE SEEN Final  Surgical pcr screen     Status: None   Collection Time: 11/28/14  7:51 AM  Result Value Ref Range Status   MRSA, PCR NEGATIVE NEGATIVE Final   Staphylococcus aureus NEGATIVE NEGATIVE Final    Comment:        The Xpert SA Assay (FDA approved for NASAL specimens in patients over 42 years of age), is one component of a comprehensive surveillance program.  Test performance has been validated by [facility] for patients greater than or equal to 59 year old. It is not intended to diagnose infection nor to guide or monitor treatment.   Culture, body fluid-bottle     Status: None   Collection Time: 11/28/14 10:19 AM  Result Value Ref Range Status   Specimen Description FLUID PERITONEAL  Final   Special Requests NONE  Final   Gram Stain   Final    ANAEROBIC BOTTLE ONLY GRAM NEGATIVE  RODS CRITICAL RESULT CALLED TO, READ BACK BY AND VERIFIED WITH: S.WRIGHT,RN [id number] 11/30/14 M.CAMPBELL GRAM STAIN REVIEWED-AGREE WITH RESULT R GREEN AEROBIC BOTTLE ONLY GRAM NEGATIVE RODS    Culture   Final    ESCHERICHIA COLI PSEUDOMONAS AERUGINOSA POSSIBLE ANAEROBIC GRAM POSITIVE COCCI PRESENT BUT UNABLE TO ISOLATE TO IDENTIFY Performed at [facility]    Report Status 12/05/2014 FINAL  Final   Organism ID, Bacteria ESCHERICHIA COLI  Final   Organism ID, Bacteria PSEUDOMONAS AERUGINOSA  Final      Susceptibility   Escherichia coli - MIC*    AMPICILLIN <=2 SENSITIVE Sensitive     CEFAZOLIN <=4 SENSITIVE Sensitive     CEFEPIME <=1 SENSITIVE Sensitive     CEFTAZIDIME <=1 SENSITIVE Sensitive     CEFTRIAXONE <=1 SENSITIVE Sensitive     CIPROFLOXACIN <=0.25 SENSITIVE Sensitive     GENTAMICIN <=1 SENSITIVE Sensitive     IMIPENEM <=0.25 SENSITIVE Sensitive     TRIMETH/SULFA <=20 SENSITIVE Sensitive     AMPICILLIN/SULBACTAM <=2 SENSITIVE Sensitive     PIP/TAZO <=4 SENSITIVE Sensitive     * ESCHERICHIA COLI   Pseudomonas aeruginosa - MIC*    CEFTAZIDIME 4 SENSITIVE Sensitive     CIPROFLOXACIN <=0.25 SENSITIVE Sensitive     GENTAMICIN <=1 SENSITIVE Sensitive     IMIPENEM 2 SENSITIVE Sensitive     PIP/TAZO 8 SENSITIVE Sensitive  CEFEPIME 2 SENSITIVE Sensitive     * PSEUDOMONAS AERUGINOSA  Gram stain     Status: None   Collection Time: 11/28/14 10:19 AM  Result Value Ref Range Status   Specimen Description FLUID PERITONEAL  Final   Special Requests NONE  Final   Gram Stain   Final    ABUNDANT WBC PRESENT,BOTH PMN AND MONONUCLEAR NO ORGANISMS SEEN Performed at [facility]    Report Status 11/28/2014 FINAL  Final    Medical History: Past Medical History  Diagnosis Date  . Fibroadenoma of breast 2003  . Ulcer   . Cancer     Assessment:  38 female with complaint of fever at home (101F) and abdominal pain.  S/P appendectomy on 11/28/14  CT scan shows changes  that could be superinfection vs abscess  Pharmacy consulted to dose Zosyn for intraabdominal abscesses  Goal of Therapy:  Eradication of suspected infection  Plan:  Zosyn 3.375gm IV q8h (each dose infused over 4 hrs)  Kapena Hamme, Toribio Harbour, PharmD 12/19/2014,5:37 PM

## 2014-12-19 NOTE — ED Notes (Signed)
Pt had open appendectomy on 7/5. Pt began to run fever of 101F on Sunday morning along with abd pain. Pt previously had a prior attempted appendectomy that was complicated by an abscess.  Reports incision site seems to be healing up well. No emesis

## 2014-12-19 NOTE — ED Notes (Signed)
Modena Jansky PA pager: [phone]

## 2014-12-19 NOTE — ED Provider Notes (Signed)
CSN: [id number]     Arrival date & time 12/19/14  1337 History   First MD Initiated Contact with Patient 12/19/14 1350     Chief Complaint  Patient presents with  . Abdominal Pain     (Consider location/radiation/quality/duration/timing/severity/associated sxs/prior Treatment) HPI Complains of lower abdominal pain onset 2 days ago, nonradiating, now diffusely lower abdomen presently mild after treatment with ibuprofen at 10 AM this morning pain originated at left lower quadrant. Pain is now mild after treatment with ibuprofen at 10 AM today. No nausea or vomiting no anorexia no urinary symptoms. Last bowel movement yesterday, normal. Last normal menstrual period approximately 4 weeks ago. No other associated symptoms. Past Medical History  Diagnosis Date  . Fibroadenoma of breast 2003  . Ulcer   . Cancer    Past Surgical History  Procedure Laterality Date  . Breast excisional biopsy  2003    benign  . Laparoscopic appendectomy N/A 07/12/2014    Procedure: LAPAROSCOPIC EXPLORATION AND DRAINAGE OF WHITE PELVIC ABCESS WITH  INSERTION OF DRAIN;  Surgeon: Alphonsa Overall, MD;  Location: [facility];  Service: General;  Laterality: N/A;  . Laparoscopic abdominal exploration N/A 11/28/2014    Procedure: LAPAROSCOPY,LAPAROTOMY,APPENDECTOMY,RIGHT SALPINGECTOMY;  Surgeon: Fanny Skates, MD;  Location: [facility];  Service: General;  Laterality: N/A;   Family History  Problem Relation Age of Onset  . Breast cancer Mother   . Breast cancer Paternal Grandmother   . Uterine cancer Maternal Grandmother    History  Substance Use Topics  . Smoking status: Never Smoker   . Smokeless tobacco: Never Used  . Alcohol Use: 0.5 oz/week    1 Standard drinks or equivalent per week     Comment: socially    OB History    Gravida Para Term Preterm AB TAB SAB Ectopic Multiple Living   0 0 0 0 0 0 0 0 0 0      Review of Systems  Constitutional: Positive for fever.  HENT: Negative.   Respiratory: Negative.    Cardiovascular: Negative.   Gastrointestinal: Positive for abdominal pain.  Musculoskeletal: Negative.   Skin: Negative.   Neurological: Negative.   Psychiatric/Behavioral: Negative.   All other systems reviewed and are negative.     Allergies  Other  Home Medications   Prior to Admission medications   Medication Sig Start Date End Date Taking? Authorizing Provider  amoxicillin-clavulanate (AUGMENTIN) 875-125 MG per tablet Take 1 tablet by mouth 2 (two) times daily. 11/24/75   Leighton Ruff, MD  aspirin 325 MG tablet Take 650 mg by mouth every 4 (four) hours as needed.    Historical Provider, MD  Beclomethasone Dipropionate 80 MCG/ACT AERS Place 2 sprays into the nose daily. Patient not taking: Reported on 11/27/2014 09/27/12   Marcial Pacas, DO  Fish Oil OIL Take 5 mLs by mouth daily.    Historical Provider, MD  HYDROcodone-acetaminophen (NORCO/VICODIN) 5-325 MG per tablet Take 1 tablet by mouth every 4 (four) hours as needed for moderate pain. 9/39/03   Leighton Ruff, MD  ibuprofen (ADVIL,MOTRIN) 200 MG tablet Take 400 mg by mouth every 4 (four) hours as needed for headache or moderate pain.     Historical Provider, MD  Multiple Minerals-Vitamins (CALCIUM & VIT D3 BONE HEALTH) LIQD Take 5 mLs by mouth daily.    Historical Provider, MD   BP 108/57 mmHg  Pulse 87  Temp(Src) 98.7 F (37.1 C) (Oral)  Resp 16  SpO2 98%  LMP 11/13/2014 Physical Exam  Constitutional: She appears well-developed  and well-nourished.  HENT:  Head: Normocephalic and atraumatic.  Eyes: Conjunctivae are normal. Pupils are equal, round, and reactive to light.  Neck: Neck supple. No tracheal deviation present. No thyromegaly present.  Cardiovascular: Normal rate and regular rhythm.   No murmur heard. Pulmonary/Chest: Effort normal and breath sounds normal.  Abdominal: Soft. Bowel sounds are normal. She exhibits no distension.  Open wound infraumbilical area midline, wound is clean-appearing without drainage.  There is also a well-healed surgical wound at the left upper quadrant. Minimally tender at bilateral lower quadrants and periumbilical area.  Musculoskeletal: Normal range of motion. She exhibits no edema or tenderness.  Neurological: She is alert. Coordination normal.  Skin: Skin is warm and dry. No rash noted.  Psychiatric: She has a normal mood and affect.  Nursing note and vitals reviewed.   ED Course  Procedures (including critical care time) Labs Review Labs Reviewed  COMPREHENSIVE METABOLIC PANEL  CBC WITH DIFFERENTIAL/PLATELET  LIPASE, BLOOD  I-STAT BETA HCG BLOOD, ED ([facility], [facility], [facility] ONLY)    Imaging Review No results found.   EKG Interpretation None     declines pain medicine Results for orders placed or performed during the hospital encounter of 12/19/14  Comprehensive metabolic panel  Result Value Ref Range   Sodium 136 135 - 145 mmol/L   Potassium 3.4 (L) 3.5 - 5.1 mmol/L   Chloride 103 101 - 111 mmol/L   CO2 24 22 - 32 mmol/L   Glucose, Bld 95 65 - 99 mg/dL   BUN 11 6 - 20 mg/dL   Creatinine, Ser 0.82 0.44 - 1.00 mg/dL   Calcium 8.9 8.9 - 10.3 mg/dL   Total Protein 7.6 6.5 - 8.1 g/dL   Albumin 3.5 3.5 - 5.0 g/dL   AST 17 15 - 41 U/L   ALT 28 14 - 54 U/L   Alkaline Phosphatase 60 38 - 126 U/L   Total Bilirubin 0.8 0.3 - 1.2 mg/dL   GFR calc non Af Amer >60 >60 mL/min   GFR calc Af Amer >60 >60 mL/min   Anion gap 9 5 - 15  CBC with Differential  Result Value Ref Range   WBC 16.8 (H) 4.0 - 10.5 K/uL   RBC 3.90 3.87 - 5.11 MIL/uL   Hemoglobin 11.7 (L) 12.0 - 15.0 g/dL   HCT 35.1 (L) 36.0 - 46.0 %   MCV 90.0 78.0 - 100.0 fL   MCH 30.0 26.0 - 34.0 pg   MCHC 33.3 30.0 - 36.0 g/dL   RDW 14.9 11.5 - 15.5 %   Platelets 278 150 - 400 K/uL   Neutrophils Relative % 81 (H) 43 - 77 %   Neutro Abs 13.8 (H) 1.7 - 7.7 K/uL   Lymphocytes Relative 10 (L) 12 - 46 %   Lymphs Abs 1.6 0.7 - 4.0 K/uL   Monocytes Relative 8 3 - 12 %   Monocytes Absolute 1.3 (H) 0.1 - 1.0  K/uL   Eosinophils Relative 1 0 - 5 %   Eosinophils Absolute 0.1 0.0 - 0.7 K/uL   Basophils Relative 0 0 - 1 %   Basophils Absolute 0.0 0.0 - 0.1 K/uL  Lipase, blood  Result Value Ref Range   Lipase 25 22 - 51 U/L  Urinalysis, Routine w reflex microscopic (not at [facility])  Result Value Ref Range   Color, Urine AMBER (A) YELLOW   APPearance CLOUDY (A) CLEAR   Specific Gravity, Urine 1.019 1.005 - 1.030   pH 6.0 5.0 - 8.0  Glucose, UA NEGATIVE NEGATIVE mg/dL   Hgb urine dipstick NEGATIVE NEGATIVE   Bilirubin Urine NEGATIVE NEGATIVE   Ketones, ur 15 (A) NEGATIVE mg/dL   Protein, ur NEGATIVE NEGATIVE mg/dL   Urobilinogen, UA 0.2 0.0 - 1.0 mg/dL   Nitrite NEGATIVE NEGATIVE   Leukocytes, UA NEGATIVE NEGATIVE  I-Stat Beta hCG blood, ED ([facility], [facility], [facility] only)  Result Value Ref Range   I-stat hCG, quantitative <5.0 <5 mIU/mL   Comment 3           Ct Abdomen Pelvis W Contrast  11/27/2014   CLINICAL DATA:  Right lower quadrant pain.  EXAM: CT ABDOMEN AND PELVIS WITH CONTRAST  TECHNIQUE: Multidetector CT imaging of the abdomen and pelvis was performed using the standard protocol following bolus administration of intravenous contrast.  CONTRAST:  112mL OMNIPAQUE IOHEXOL 300 MG/ML  SOLN  COMPARISON:  07/28/2014  FINDINGS: Lung bases are clear.  No effusions.  Heart is normal size.  Liver, spleen, pancreas, gallbladder, adrenals and kidneys are unremarkable.  There is moderate free fluid around the liver and spleen as well as in the paracolic gutters and pelvis. Appendix is visualized with a diameter of 9 mm and suggestion of mucosal enhancement. Small amount a gas is noted near the tip of the appendix.  Uterus is unremarkable. Cystic areas noted in the right adnexa could reflect small ovarian cysts or hydrosalpinx.  Small bowel loops are mildly prominent throughout the abdomen and pelvis. Several loops of small bowel in the pelvis appears thick walled. This could reflect enteritis or secondary inflammation.  Large bowel unremarkable. Aorta is normal caliber. No free air or adenopathy.  IMPRESSION: Appendix is mildly prominent, measuring 9 mm in diameter with mucosal enhancement. There is moderate free fluid in the abdomen and pelvis. Given the history of ruptured appendicitis, I cannot exclude recurrent ruptured appendicitis. Overall appearance similar to prior study although the free fluid is larger on today's study.  Areas of small bowel wall thickening in the pelvis could be related to primary enteritis or secondary inflammation.  Small cystic areas within the right adnexa could reflect ovarian cysts or hydrosalpinx.   Electronically Signed   By: Rolm Baptise M.D.   On: 11/27/2014 09:06    MDM  Surgical service aware of patient and will follow patient and lab work in ED Dr. Thomasene Lot aware of pt  Dx#1 abdominal pain #2 hypokalemia #3 leukocytosis Final diagnoses:  None        Orlie Dakin, MD 12/19/14 1611

## 2014-12-19 NOTE — Consult Note (Signed)
Reason for Consult: pain and fever post op Referring Physician: Orlie Dakin  Katrina Deleon is an 39 y.o. female.  HPI: 39 y/o who presented 07/06/14 with diffuse abdominal discomfort from [facility]. CT scan showed an inflammatory changes, but could not really tell if the was a tubo-ovarian infection or appendix, appendix was not visualized original CT scan. They offered admission but she went home.  She returned on 2/15 with RLQ pain and WBC 18.5K.  She was admitted and taken to the OR on 07/13/14, by Dr. Lucia Gaskins with: Laparoscopic exploration with evacuation of right pelvic abscess and irrigation of the abdominal cavity.  He could not find the appendix.  She did well and ultimately discharged on  07/18/14. She did well till she returned on 11/27/14 with recurrent pain, ascites around her liver and spleen. Some stranding around what was thought to be her appendix.  She was taken back tot he OR on 11/27/14, with a post op diagnosis of Recurrent appendicitis and right hydrosalpinx. Surgery at that time included:  Diagnostic laparoscopy, peritoneal washings for culture, laparotomy, appendectomy, right salpingectomy, by Dr. Dalbert Batman and Janie Morning.  Findings include:  a purulent vaginal discharge noted when the Foley catheter was inserted. The urine was clear. There was a right lower quadrant inflammatory process. There were a few loops of small bowel with inflammatory exudate present. The appendix was densely adherent to the lateral aspect of the right tube and ovary and it looked chronically and acutely inflamed. The right ovary looks normal. The right tube was soft but was quite swollen. There was no purulence coming from the fallopian tube. The left tube and ovary looked fairly normal although it was tethered in the pelvis. Dr. Skeet Latch recommended that the right fallopian tube be removed because of its appearance and the history and the risk of future ectopic.[name] There was some bloody brownish fluid in the  right subphrenic and left subphrenic areas. This really did not have much odor. Post op she did well and was discharged on 12/03/14.  She did well till 7/23 when she started having some pain lower abdomen. She says she can feel stool moving thru colon. She developed some pain and low grade fever Sunday 7/24, but yesterday pain increased and she had one temp up to 101.  She called the office and was referred to the ED.  Currently she is still having some pain, mostly left lower abdomen. No nausea, no vomiting , she has been eating without issue. Currently labs show WBC 16.8 with left shift, K+ low, otherwise normal CMP. CT/UA are pending. UA negative.  CT scan: The appendix and right ovary have been removed. Uterus is deviated to the right and there is evidence of loculated appearing fluid along the lateral aspect of the uterus on the left best. Adjacent to this is a considerable amount of inflammatory change as well as multiloculated fluid collection which may be related to the left ovary although could be adjacent to the left ovary. This measures approximately 3.8 x 5.5cm. To the right of the uterus best seen on image number 76 of series to there is a second fluid collection identified which may be related to the recent surgery. It measures approximately 3.8 by 1.8 cm. These changes may represent superinfection from the recent surgery and ruptured appendix although may be related to subsequent tubo-ovarian abscess on the left. I have reviewed this with Dr. Barry Dienes and have contacted OB-Gyn, Dr. Claiborne Billings Leggett's office for a consult.  We plan  to admit her and start antibiotics.    Past Medical History  Diagnosis Date  . Fibroadenoma of breast 2003  . Ulcer   . Cancer     Past Surgical History  Procedure Laterality Date  . Breast excisional biopsy  2003    benign  . Laparoscopic appendectomy N/A 07/12/2014    Procedure: LAPAROSCOPIC EXPLORATION AND DRAINAGE OF WHITE PELVIC ABCESS WITH  INSERTION OF  DRAIN;  Surgeon: Alphonsa Overall, MD;  Location: [facility];  Service: General;  Laterality: N/A;  . Laparoscopic abdominal exploration N/A 11/28/2014    Procedure: LAPAROSCOPY,LAPAROTOMY,APPENDECTOMY,RIGHT SALPINGECTOMY;  Surgeon: Fanny Skates, MD;  Location: [facility];  Service: General;  Laterality: N/A;    Family History  Problem Relation Age of Onset  . Breast cancer Mother   . Breast cancer Paternal Grandmother   . Uterine cancer Maternal Grandmother     Social History:  reports that she has never smoked. She has never used smokeless tobacco. She reports that she drinks about 0.5 oz of alcohol per week. She reports that she does not use illicit drugs.  Allergies:  Allergies  Allergen Reactions  . Other     Walnuts, pecans     Medications:  Prior to Admission:   She says she is just taking ibuprofen currently for pain and fever. Prior to Admission medications   Medication Sig Start Date End Date Taking? Authorizing Provider  Fish Oil OIL Take 5 mLs by mouth daily.   Yes Historical Provider, MD  ibuprofen (ADVIL,MOTRIN) 200 MG tablet Take 400 mg by mouth every 4 (four) hours as needed for headache or moderate pain.    Yes Historical Provider, MD  Multiple Minerals-Vitamins (CALCIUM & VIT D3 BONE HEALTH) LIQD Take 5 mLs by mouth daily.   Yes Historical Provider, MD  amoxicillin-clavulanate (AUGMENTIN) 875-125 MG per tablet Take 1 tablet by mouth 2 (two) times daily. Patient not taking: Reported on 4/82/5003 11/27/86   Leighton Ruff, MD  Beclomethasone Dipropionate 80 MCG/ACT AERS Place 2 sprays into the nose daily. Patient not taking: Reported on 11/27/2014 09/27/12   Marcial Pacas, DO  HYDROcodone-acetaminophen (NORCO/VICODIN) 5-325 MG per tablet Take 1 tablet by mouth every 4 (four) hours as needed for moderate pain. Patient not taking: Reported on 8/91/6945 0/38/88   Leighton Ruff, MD   Scheduled: Continuous: PRN: Anti-infectives    None      Results for orders placed or performed  during the hospital encounter of 12/19/14 (from the past 48 hour(s))  I-Stat Beta hCG blood, ED ([facility], [facility], [facility] only)     Status: None   Collection Time: 12/19/14  1:58 PM  Result Value Ref Range   I-stat hCG, quantitative <5.0 <5 mIU/mL   Comment 3            Comment:   GEST. AGE      CONC.  (mIU/mL)   <=1 WEEK        5 - 50     2 WEEKS       50 - 500     3 WEEKS       100 - 10,000     4 WEEKS     1,000 - 30,000        FEMALE AND NON-PREGNANT FEMALE:     LESS THAN 5 mIU/mL   CBC with Differential     Status: Abnormal   Collection Time: 12/19/14  2:09 PM  Result Value Ref Range   WBC 16.8 (H) 4.0 - 10.5  K/uL   RBC 3.90 3.87 - 5.11 MIL/uL   Hemoglobin 11.7 (L) 12.0 - 15.0 g/dL   HCT 35.1 (L) 36.0 - 46.0 %   MCV 90.0 78.0 - 100.0 fL   MCH 30.0 26.0 - 34.0 pg   MCHC 33.3 30.0 - 36.0 g/dL   RDW 14.9 11.5 - 15.5 %   Platelets 278 150 - 400 K/uL   Neutrophils Relative % 81 (H) 43 - 77 %   Neutro Abs 13.8 (H) 1.7 - 7.7 K/uL   Lymphocytes Relative 10 (L) 12 - 46 %   Lymphs Abs 1.6 0.7 - 4.0 K/uL   Monocytes Relative 8 3 - 12 %   Monocytes Absolute 1.3 (H) 0.1 - 1.0 K/uL   Eosinophils Relative 1 0 - 5 %   Eosinophils Absolute 0.1 0.0 - 0.7 K/uL   Basophils Relative 0 0 - 1 %   Basophils Absolute 0.0 0.0 - 0.1 K/uL    No results found.  Review of Systems  Constitutional: Positive for fever and chills (up to 101 last PM). Negative for weight loss, malaise/fatigue and diaphoresis.  Eyes: Negative.   Respiratory: Negative.   Cardiovascular: Negative.   Gastrointestinal: Positive for abdominal pain (rather difuse pain over entire abdomen,more on left side right now.). Negative for heartburn, nausea, vomiting, diarrhea, constipation, blood in stool and melena.       Stools are soft and she says she can feel it moving thru abdomen. She has been having discomfort just prior to BM's also.  Musculoskeletal: Negative.   Skin: Negative.   Neurological: Negative.  Negative for weakness.   Endo/Heme/Allergies: Negative.   Psychiatric/Behavioral: Negative.    Blood pressure 108/57, pulse 87, temperature 98.7 F (37.1 C), temperature source Oral, resp. rate 16, last menstrual period 11/13/2014, SpO2 98 %. Physical Exam  Constitutional: She is oriented to person, place, and time. No distress.  Thin WF NAD, still feels bad  HENT:  Head: Normocephalic and atraumatic.  Eyes: Conjunctivae and EOM are normal. Right eye exhibits no discharge. Left eye exhibits no discharge. No scleral icterus.  Neck: Normal range of motion. Neck supple. No JVD present. No tracheal deviation present. No thyromegaly present.  Cardiovascular: Normal rate, regular rhythm, normal heart sounds and intact distal pulses.   No murmur heard. Respiratory: Effort normal and breath sounds normal. No respiratory distress. She has no wheezes. She has no rales. She exhibits no tenderness.  GI: Soft. Bowel sounds are normal. She exhibits no distension and no mass. There is tenderness (she feels most tender left side more toward lower quad, but not very specific.). There is no rebound and no guarding.  Musculoskeletal: She exhibits no edema or tenderness.  Lymphadenopathy:    She has no cervical adenopathy.  Neurological: She is alert and oriented to person, place, and time. No cranial nerve deficit.  Skin: Skin is warm and dry. No rash noted. She is not diaphoretic. No erythema. No pallor.  Psychiatric: She has a normal mood and affect. Her behavior is normal. Judgment and thought content normal.    Assessment/Plan: 1.  Complex right and left intraabdominal abscesses, involving or adjacent to left ovary 2.  Hx of Recurrent appendicitis, right hydrosalpinx 11/28/14 with Diagnostic laparoscopy, peritoneal washings for culture, laparotomy, appendectomy, right salpingectomy; Dr. Fanny Skates and Dr. Janie Morning   Plan:  We are going to admit, and start antibiotics.  NPO after midnight, I have contacted OB-Gyn Dr.  Roseanne Kaufman office and ask them to assist with her  care.  I have reviewed films with IR. Will discuss  after everyone has had an opportunity to see and review studies.  She had E coli and Pseudomonas on last admit and cultures, very sensitive so we will start Zosyn tonight.  Jaythen Hamme 12/19/2014, 2:32 PM

## 2014-12-19 NOTE — H&P (Signed)
Expand All Collapse All   Reason for Consult: pain and fever post op Referring Physician: Orlie Dakin OZY:[id number], JADE, PA-C  Ob-gyn:  DR. Silas Sacramento   Katrina Deleon is an 39 y.o. female.   HPI: 39 y/o who presented 07/06/14 with diffuse abdominal discomfort from [facility]. CT scan showed an inflammatory changes, but could not really tell if the was a tubo-ovarian infection or appendix, appendix was not visualized original CT scan. They offered admission but she went home. She returned on 2/15 with RLQ pain and WBC 18.5K. She was admitted and taken to the OR on 07/13/14, by Dr. Lucia Gaskins with: Laparoscopic exploration with evacuation of right pelvic abscess and irrigation of the abdominal cavity. He could not find the appendix. She did well and ultimately discharged on 07/18/14. She did well till she returned on 11/27/14 with recurrent pain, ascites around her liver and spleen. Some stranding around what was thought to be her appendix. She was taken back tot he OR on 11/27/14, with a post op diagnosis of Recurrent appendicitis and right hydrosalpinx. Surgery at that time included: Diagnostic laparoscopy, peritoneal washings for culture, laparotomy, appendectomy, right salpingectomy, by Dr. Dalbert Batman and Janie Morning. Findings include: a purulent vaginal discharge noted when the Foley catheter was inserted. The urine was clear. There was a right lower quadrant inflammatory process. There were a few loops of small bowel with inflammatory exudate present. The appendix was densely adherent to the lateral aspect of the right tube and ovary and it looked chronically and acutely inflamed. The right ovary looks normal. The right tube was soft but was quite swollen. There was no purulence coming from the fallopian tube. The left tube and ovary looked fairly normal although it was tethered in the pelvis. Dr. Skeet Latch recommended that the right fallopian tube be removed because of its appearance and the  history and the risk of future ectopic.[name] There was some bloody brownish fluid in the right subphrenic and left subphrenic areas. This really did not have much odor. Post op she did well and was discharged on 12/03/14. She did well till 7/23 when she started having some pain lower abdomen. She says she can feel stool moving thru colon. She developed some pain and low grade fever Sunday 7/24, but yesterday pain increased and she had one temp up to 101. She called the office and was referred to the ED. Currently she is still having some pain, mostly left lower abdomen. No nausea, no vomiting , she has been eating without issue. Currently labs show WBC 16.8 with left shift, K+ low, otherwise normal CMP. CT/UA are pending. UA negative. CT scan: The appendix and right ovary have been removed. Uterus is deviated to the right and there is evidence of loculated appearing fluid along the lateral aspect of the uterus on the left best. Adjacent to this is a considerable amount of inflammatory change as well as multiloculated fluid collection which may be related to the left ovary although could be adjacent to the left ovary. This measures approximately 3.8 x 5.5cm. To the right of the uterus best seen on image number 76 of series to there is a second fluid collection identified which may be related to the recent surgery. It measures approximately 3.8 by 1.8 cm. These changes may represent superinfection from the recent surgery and ruptured appendix although may be related to subsequent tubo-ovarian abscess on the left. I have reviewed this with Dr. Barry Dienes and have contacted OB-Gyn, Dr. Claiborne Billings Leggett's office for a  consult. We plan to admit her and start antibiotics.   Past Medical History  Diagnosis Date  . Fibroadenoma of breast 2003  . Ulcer   . Cancer     Past Surgical History  Procedure Laterality Date  . Breast excisional biopsy  2003    benign  . Laparoscopic  appendectomy N/A 07/12/2014    Procedure: LAPAROSCOPIC EXPLORATION AND DRAINAGE OF WHITE PELVIC ABCESS WITH INSERTION OF DRAIN; Surgeon: Alphonsa Overall, MD; Location: [facility]; Service: General; Laterality: N/A;  . Laparoscopic abdominal exploration N/A 11/28/2014    Procedure: LAPAROSCOPY,LAPAROTOMY,APPENDECTOMY,RIGHT SALPINGECTOMY; Surgeon: Fanny Skates, MD; Location: [facility]; Service: General; Laterality: N/A;    Family History  Problem Relation Age of Onset  . Breast cancer Mother   . Breast cancer Paternal Grandmother   . Uterine cancer Maternal Grandmother     Social History:  reports that she has never smoked. She has never used smokeless tobacco. She reports that she drinks about 0.5 oz of alcohol per week. She reports that she does not use illicit drugs.  Allergies:  Allergies  Allergen Reactions  . Other     Walnuts, pecans     Medications:  Prior to Admission: She says she is just taking ibuprofen currently for pain and fever. Prior to Admission medications   Medication Sig Start Date End Date Taking? Authorizing Provider  Fish Oil OIL Take 5 mLs by mouth daily.   Yes Historical Provider, MD  ibuprofen (ADVIL,MOTRIN) 200 MG tablet Take 400 mg by mouth every 4 (four) hours as needed for headache or moderate pain.    Yes Historical Provider, MD  Multiple Minerals-Vitamins (CALCIUM & VIT D3 BONE HEALTH) LIQD Take 5 mLs by mouth daily.   Yes Historical Provider, MD  amoxicillin-clavulanate (AUGMENTIN) 875-125 MG per tablet Take 1 tablet by mouth 2 (two) times daily. Patient not taking: Reported on 7/84/6962 9/52/84   Leighton Ruff, MD  Beclomethasone Dipropionate 80 MCG/ACT AERS Place 2 sprays into the nose daily. Patient not taking: Reported on 11/27/2014 09/27/12   Marcial Pacas, DO  HYDROcodone-acetaminophen (NORCO/VICODIN) 5-325 MG per tablet Take 1 tablet by mouth every 4 (four) hours as needed  for moderate pain. Patient not taking: Reported on 1/32/4401 0/27/25   Leighton Ruff, MD   Scheduled: Continuous: PRN: Anti-infectives    None       Lab Results Last 48 Hours    Results for orders placed or performed during the hospital encounter of 12/19/14 (from the past 48 hour(s))  I-Stat Beta hCG blood, ED ([facility], [facility], [facility] only) Status: None   Collection Time: 12/19/14 1:58 PM  Result Value Ref Range   I-stat hCG, quantitative <5.0 <5 mIU/mL   Comment 3       Comment:  GEST. AGE CONC. (mIU/mL)  <=1 WEEK 5 - 50  2 WEEKS 50 - 500  3 WEEKS 100 - 10,000  4 WEEKS 1,000 - 30,000   FEMALE AND NON-PREGNANT FEMALE:  LESS THAN 5 mIU/mL   CBC with Differential Status: Abnormal   Collection Time: 12/19/14 2:09 PM  Result Value Ref Range   WBC 16.8 (H) 4.0 - 10.5 K/uL   RBC 3.90 3.87 - 5.11 MIL/uL   Hemoglobin 11.7 (L) 12.0 - 15.0 g/dL   HCT 35.1 (L) 36.0 - 46.0 %   MCV 90.0 78.0 - 100.0 fL   MCH 30.0 26.0 - 34.0 pg   MCHC 33.3 30.0 - 36.0 g/dL   RDW 14.9 11.5 - 15.5 %   Platelets 278  150 - 400 K/uL   Neutrophils Relative % 81 (H) 43 - 77 %   Neutro Abs 13.8 (H) 1.7 - 7.7 K/uL   Lymphocytes Relative 10 (L) 12 - 46 %   Lymphs Abs 1.6 0.7 - 4.0 K/uL   Monocytes Relative 8 3 - 12 %   Monocytes Absolute 1.3 (H) 0.1 - 1.0 K/uL   Eosinophils Relative 1 0 - 5 %   Eosinophils Absolute 0.1 0.0 - 0.7 K/uL   Basophils Relative 0 0 - 1 %   Basophils Absolute 0.0 0.0 - 0.1 K/uL       Imaging Results (Last 48 hours)    No results found.    Review of Systems  Constitutional: Positive for fever and chills (up to 101 last PM). Negative for weight loss, malaise/fatigue and diaphoresis.  Eyes: Negative.  Respiratory: Negative.  Cardiovascular: Negative.  Gastrointestinal: Positive for abdominal pain (rather difuse  pain over entire abdomen,more on left side right now.). Negative for heartburn, nausea, vomiting, diarrhea, constipation, blood in stool and melena.   Stools are soft and she says she can feel it moving thru abdomen. She has been having discomfort just prior to BM's also.  Musculoskeletal: Negative.  Skin: Negative.  Neurological: Negative. Negative for weakness.  Endo/Heme/Allergies: Negative.  Psychiatric/Behavioral: Negative.   Blood pressure 108/57, pulse 87, temperature 98.7 F (37.1 C), temperature source Oral, resp. rate 16, last menstrual period 11/13/2014, SpO2 98 %. Physical Exam  Constitutional: She is oriented to person, place, and time. No distress.  Thin WF NAD, still feels bad  HENT:  Head: Normocephalic and atraumatic.  Eyes: Conjunctivae and EOM are normal. Right eye exhibits no discharge. Left eye exhibits no discharge. No scleral icterus.  Neck: Normal range of motion. Neck supple. No JVD present. No tracheal deviation present. No thyromegaly present.  Cardiovascular: Normal rate, regular rhythm, normal heart sounds and intact distal pulses.  No murmur heard. Respiratory: Effort normal and breath sounds normal. No respiratory distress. She has no wheezes. She has no rales. She exhibits no tenderness.  GI: Soft. Bowel sounds are normal. She exhibits no distension and no mass. There is tenderness (she feels most tender left side more toward lower quad, but not very specific.). There is no rebound and no guarding.  Musculoskeletal: She exhibits no edema or tenderness.  Lymphadenopathy:   She has no cervical adenopathy.  Neurological: She is alert and oriented to person, place, and time. No cranial nerve deficit.  Skin: Skin is warm and dry. No rash noted. She is not diaphoretic. No erythema. No pallor.  Psychiatric: She has a normal mood and affect. Her behavior is normal. Judgment and thought content normal.    Assessment/Plan: 1. Complex right and left  intraabdominal abscesses, involving or adjacent to left ovary 2. Hx of Recurrent appendicitis, right hydrosalpinx 11/28/14 with Diagnostic laparoscopy, peritoneal washings for culture, laparotomy, appendectomy, right salpingectomy; Dr. Fanny Skates and Dr. Janie Morning   Plan: We are going to admit, and start antibiotics. NPO after midnight, I have contacted OB-Gyn Dr. Roseanne Kaufman office and ask them to assist with her care. I have reviewed films with IR. Will discuss after everyone has had an opportunity to see and review studies. She had E coli and Pseudomonas on last admit and cultures, very sensitive so we will start Zosyn tonight.  Katrina Deleon 12/19/2014, 2:32 PM

## 2014-12-20 ENCOUNTER — Inpatient Hospital Stay (HOSPITAL_COMMUNITY): Payer: Managed Care, Other (non HMO)

## 2014-12-20 LAB — BASIC METABOLIC PANEL
Anion gap: 6 (ref 5–15)
BUN: 7 mg/dL (ref 6–20)
CALCIUM: 8.5 mg/dL — AB (ref 8.9–10.3)
CO2: 24 mmol/L (ref 22–32)
Chloride: 109 mmol/L (ref 101–111)
Creatinine, Ser: 0.67 mg/dL (ref 0.44–1.00)
GFR calc Af Amer: >60 mL/min (ref >60)
GFR calc non Af Amer: >60 mL/min (ref >60)
Glucose, Bld: 96 mg/dL (ref 65–99)
Potassium: 3.8 mmol/L (ref 3.5–5.1)
Sodium: 139 mmol/L (ref 135–145)

## 2014-12-20 LAB — PREALBUMIN: Prealbumin: 9.7 mg/dL — ABNORMAL LOW (ref 18–38)

## 2014-12-20 LAB — CBC
HCT: 29.4 % — ABNORMAL LOW (ref 36.0–46.0)
Hemoglobin: 9.6 g/dL — ABNORMAL LOW (ref 12.0–15.0)
MCH: 29 pg (ref 26.0–34.0)
MCHC: 32.7 g/dL (ref 30.0–36.0)
MCV: 88.8 fL (ref 78.0–100.0)
PLATELETS: 202 10*3/uL (ref 150–400)
RBC: 3.31 MIL/uL — ABNORMAL LOW (ref 3.87–5.11)
RDW: 14.8 % (ref 11.5–15.5)
WBC: 9.7 10*3/uL (ref 4.0–10.5)

## 2014-12-20 LAB — MAGNESIUM: Magnesium: 1.9 mg/dL (ref 1.7–2.4)

## 2014-12-20 LAB — PROTIME-INR
INR: 1.25 (ref 0.00–1.49)
PROTHROMBIN TIME: 15.8 s — AB (ref 11.6–15.2)

## 2014-12-20 LAB — APTT: aPTT: 42 seconds — ABNORMAL HIGH (ref 24–37)

## 2014-12-20 NOTE — Progress Notes (Addendum)
Subjective: Feels better walking halls with husband.  Says her BM's are discolored.  Objective: Vital signs in last 24 hours: Temp:  [98.2 F (36.8 C)-99.2 F (37.3 C)] 98.2 F (36.8 C) (07/27 0518) Pulse Rate:  [57-77] 57 (07/27 0518) Resp:  [16-18] 18 (07/27 0518) BP: (92-117)/(41-61) 96/41 mmHg (07/27 0518) SpO2:  [93 %-100 %] 99 % (07/27 0518) Weight:  [64.1 kg (141 lb 5 oz)] 64.1 kg (141 lb 5 oz) (07/26 1852) Last BM Date: 12/19/14 NPO Nothing in I/O currently Afebrile, VSS, BP down some WBC imporved IR requested, for Transvaginal US first. Intake/Output from previous day:   Intake/Output this shift:    General appearance: alert, cooperative, no distress and Walking the halls and says she feels better.   GI: soft, less tender  Lab Results:   Recent Labs  12/19/14 1409 12/20/14 0525  WBC 16.8* 9.7  HGB 11.7* 9.6*  HCT 35.1* 29.4*  PLT 278 202    BMET  Recent Labs  12/19/14 1409 12/20/14 0525  NA 136 139  K 3.4* 3.8  CL 103 109  CO2 24 24  GLUCOSE 95 96  BUN 11 7  CREATININE 0.82 0.67  CALCIUM 8.9 8.5*   PT/INR  Recent Labs  12/20/14 0525  LABPROT 15.8*  INR 1.25     Recent Labs Lab 12/19/14 1409  AST 17  ALT 28  ALKPHOS 60  BILITOT 0.8  PROT 7.6  ALBUMIN 3.5     Lipase     Component Value Date/Time   LIPASE 25 12/19/2014 1409     Studies/Results: Ct Abdomen Pelvis W Contrast  12/19/2014   CLINICAL DATA:  Status post appendectomy with bilateral flank pain for 1 week  EXAM: CT ABDOMEN AND PELVIS WITH CONTRAST  TECHNIQUE: Multidetector CT imaging of the abdomen and pelvis was performed using the standard protocol following bolus administration of intravenous contrast.  CONTRAST:  113mL OMNIPAQUE IOHEXOL 300 MG/ML  SOLN  COMPARISON:  11/27/2014  FINDINGS: The lung bases are free of acute infiltrate or sizable effusion. The liver, gallbladder, spleen, adrenal glands and pancreas are within normal limits. The kidneys are well  visualized bilaterally and demonstrate a normal enhancement pattern. Normal excretion of contrast material is seen.  The appendix and right ovary have been removed. Uterus is deviated to the right and there is evidence of loculated appearing fluid along the lateral aspect of the uterus on the left best seen on image number 76 of series 2. Adjacent to this is a considerable amount of inflammatory change as well as multiloculated fluid collection which may be related to the left ovary although could be adjacent to the left ovary. This measures approximately 3.8 x 5.5 cm. To the right of the uterus best seen on image number 76 of series to there is a second fluid collection identified which may be related to the recent surgery. It measures approximately 3.8 by 1.8 cm. These changes may represent superinfection from the recent surgery and ruptured appendix although may be related to subsequent tubo-ovarian abscess on the left.  There remains significant inflammatory change of a loop of small bowel coursing anterior to this as has been present on multiple previous exams. The bladder is well distended. No bony abnormality is seen.  IMPRESSION: Changes in the pelvis consistent with significant inflammatory change and likely abscess formation on the left. It is uncertain whether this represents a tubo-ovarian abscess or some superinfection postoperatively. Surgical consultation is recommended.  These results were called by telephone  at the time of interpretation on 12/19/2014 at 4:08 pm to Zenovia Jarred, MD, who verbally acknowledged these results.   Electronically Signed   By: Inez Catalina M.D.   On: 12/19/2014 16:06    Medications: . heparin  5,000 Units Subcutaneous 3 times per day  . piperacillin-tazobactam (ZOSYN)  IV  3.375 g Intravenous Q8H    Assessment/Plan 1. Complex right and left intraabdominal abscesses, involving or adjacent to left ovary 2. Hx of Recurrent appendicitis, right hydrosalpinx  11/28/14 with Diagnostic laparoscopy, peritoneal washings for culture, laparotomy, appendectomy, right salpingectomy; Dr. Fanny Skates and Dr. Janie Morning 3.  Malnutrition with prealbumin 9.7 4.  Borderline coagulation studies/Normal LFT's  Plan:  Good response with antibiotics, we are getting Ultrasound today and Dr. Kathlene Cote will make decisions on IR drain today.  Continue NPO/antibiotics and hydration.   LOS: 1 day    Jayna Mulnix 12/20/2014

## 2014-12-20 NOTE — Progress Notes (Signed)
This shift pt's BP hypotensive, current reading 92/48. ccsMD on call made aware. No new orders at this time. Pt currently in no pain and resting comfortably. Will continue to monitor pt and intervene appropriately.

## 2014-12-20 NOTE — Progress Notes (Signed)
Gen. Surgery:  I made a social visit with the patient and her husband this morning. She still has pain but is hungry.  Now afebrile.  Leukocytosis improved. Events and differential diagnosis and possible treatment algorithms discussed. While tubo-ovarian abscess is possible, more logically this should be a complication of her recent appendectomy for acute appendicitis with diffuse peritonitis. Will discuss with Dr. Barry Dienes. Await gynecologic opinion. Continue nothing by mouth until decisions are made about image guided aspiration and/or drainage of her pelvic fluid collections.   Edsel Petrin. Dalbert Batman, M.D., [facility] General and Minimally invasive Surgery Breast and Colorectal Surgery Office:   [phone] Pager:   [phone]

## 2014-12-20 NOTE — Progress Notes (Signed)
IR received request for image guided pelvic aspiration, possible drain placement in patient with pelvic fluid collection. Dr. Kathlene Cote has reviewed the imaging and recommends transvaginal pelvic US to further characterize the abscess and given complexity of case with now improving leukocytosis we will hold off on any drain placement for now. IR to follow chart and will review pelvic US. This was discussed today with CCS Will Creig Hines PA-C  Tsosie Billing PA-C Interventional Radiology  12/20/14  9:00 AM

## 2014-12-20 NOTE — Care Management Note (Signed)
Case Management Note  Patient Details  Name: Katrina Deleon MRN: [id number] Date of Birth: February 10, 1976  Subjective/Objective:             Pelvic abcess from recent surg.       Action/Plan: Date:  December 20, 2014 U.R. performed for needs and level of care. Will continue to follow for Case Management needs.  Velva Harman, RN, BSN, [place]   [phone]  Expected Discharge Date:   (unknown)               Expected Discharge Plan:  Home/Self Care  In-House Referral:  NA  Discharge planning Services  CM Consult  Post Acute Care Choice:  NA Choice offered to:  NA  DME Arranged:  N/A DME Agency:  NA  HH Arranged:  NA HH Agency:  NA  Status of Service:  In process, will continue to follow  Medicare Important Message Given:    Date Medicare IM Given:    Medicare IM give by:    Date Additional Medicare IM Given:    Additional Medicare Important Message give by:     If discussed at Center of Stay Meetings, dates discussed:    Additional Comments:  Leeroy Cha, RN 12/20/2014, 10:23 AM

## 2014-12-21 LAB — CBC
HCT: 28.5 % — ABNORMAL LOW (ref 36.0–46.0)
Hemoglobin: 9.2 g/dL — ABNORMAL LOW (ref 12.0–15.0)
MCH: 29 pg (ref 26.0–34.0)
MCHC: 32.3 g/dL (ref 30.0–36.0)
MCV: 89.9 fL (ref 78.0–100.0)
Platelets: 190 10*3/uL (ref 150–400)
RBC: 3.17 MIL/uL — ABNORMAL LOW (ref 3.87–5.11)
RDW: 14.5 % (ref 11.5–15.5)
WBC: 5.2 10*3/uL (ref 4.0–10.5)

## 2014-12-21 LAB — BASIC METABOLIC PANEL
Anion gap: 5 (ref 5–15)
BUN: 7 mg/dL (ref 6–20)
CHLORIDE: 110 mmol/L (ref 101–111)
CO2: 24 mmol/L (ref 22–32)
Calcium: 8.3 mg/dL — ABNORMAL LOW (ref 8.9–10.3)
Creatinine, Ser: 0.75 mg/dL (ref 0.44–1.00)
Glucose, Bld: 98 mg/dL (ref 65–99)
Potassium: 3.7 mmol/L (ref 3.5–5.1)
Sodium: 139 mmol/L (ref 135–145)

## 2014-12-21 MED ORDER — OXYCODONE-ACETAMINOPHEN 5-325 MG PO TABS
1.0000 | ORAL_TABLET | ORAL | Status: DC | PRN
  Administered 2014-12-22 – 2014-12-23 (×2): 2 via ORAL
  Filled 2014-12-21 (×2): qty 2

## 2014-12-21 MED ORDER — SACCHAROMYCES BOULARDII 250 MG PO CAPS
250.0000 mg | ORAL_CAPSULE | Freq: Two times a day (BID) | ORAL | Status: DC
  Administered 2014-12-21 – 2014-12-23 (×4): 250 mg via ORAL
  Filled 2014-12-21 (×9): qty 1

## 2014-12-21 NOTE — Progress Notes (Signed)
Subjective: She says she still has pain mostly in LLQ, but controlled with Tylenol.  Pt wants to know if she can eat and do antibiotics at home?  Objective: Vital signs in last 24 hours: Temp:  [97.8 F (36.6 C)-98.7 F (37.1 C)] 98.6 F (37 C) (07/28 3578) Pulse Rate:  [56-69] 57 (07/28 0638) Resp:  [16-183] 16 (07/28 0238) BP: (92-103)/(48-57) 98/50 mmHg (07/28 0638) SpO2:  [98 %-100 %] 100 % (07/28 9784) Last BM Date: 12/19/14 240 PO Afebrile, VSS Labs pending this AM IR notes they would not attempt drain placement unless pt becomes worse or fluid collection enlarges.  Intake/Output from previous day: 07/27 0701 - 07/28 0700 In: 2840 [P.O.:240; I.V.:2400; IV Piggyback:200] Out: 1 [Stool:1] Intake/Output this shift:    General appearance: alert, cooperative and no distress GI: soft still somewhat tender RLQ, + BS/BM  Lab Results:   Recent Labs  12/19/14 1409 12/20/14 0525  WBC 16.8* 9.7  HGB 11.7* 9.6*  HCT 35.1* 29.4*  PLT 278 202    BMET  Recent Labs  12/19/14 1409 12/20/14 0525  NA 136 139  K 3.4* 3.8  CL 103 109  CO2 24 24  GLUCOSE 95 96  BUN 11 7  CREATININE 0.82 0.67  CALCIUM 8.9 8.5*   PT/INR  Recent Labs  12/20/14 0525  LABPROT 15.8*  INR 1.25     Recent Labs Lab 12/19/14 1409  AST 17  ALT 28  ALKPHOS 60  BILITOT 0.8  PROT 7.6  ALBUMIN 3.5     Lipase     Component Value Date/Time   LIPASE 25 12/19/2014 1409     Studies/Results: US Transvaginal Non-ob  12/20/2014   CLINICAL DATA:  Status post appendectomy and right salpingectomy on 11/28/2014. Post- operative CT has shown a loculated fluid collection in the central and left pelvis.  EXAM: ULTRASOUND PELVIS TRANSVAGINAL  TECHNIQUE: Transvaginal ultrasound examination of the pelvis was performed including evaluation of the uterus, ovaries, adnexal regions, and pelvic cul-de-sac.  COMPARISON:  CT of the abdomen and pelvis on 12/19/2014  FINDINGS: Uterus  Measurements: 9.4  x 3.9 x 3.8 cm. No fibroids or other mass visualized.  Endometrium  Thickness: 10 mm.  No focal abnormality visualized.  Right ovary  Measurements: The right ovary is visualized and measures approximately 3.9 x 2.2 x 2.6 cm. Normal follicles are present. There is a small amount of simple appearing fluid adjacent to the right ovary.  Left ovary  Measurements: The left every measures approximately 3.4 x 1.8 x 2.1 cm. Normal follicles are visualized. There is complex fluid predominantly medial to the left ovary and an elongated area of septated fluid in the adnexal region. The more elongated portion may represent fluid and a dilated fallopian tube. A more complex portion at is more round in shape measures approximately 3.7 cm in greatest diameter.  IMPRESSION: Normal appearance of right and left ovaries. Complex fluid in the left pelvis is predominantly medial to the left ovary. Elongated and septated fluid in the adnexal region may be a second area of fluid versus a dilated fallopian tube.   Electronically Signed   By: Aletta Edouard M.D.   On: 12/20/2014 15:49   Ct Abdomen Pelvis W Contrast  12/19/2014   CLINICAL DATA:  Status post appendectomy with bilateral flank pain for 1 week  EXAM: CT ABDOMEN AND PELVIS WITH CONTRAST  TECHNIQUE: Multidetector CT imaging of the abdomen and pelvis was performed using the standard protocol following bolus administration of  intravenous contrast.  CONTRAST:  122mL OMNIPAQUE IOHEXOL 300 MG/ML  SOLN  COMPARISON:  11/27/2014  FINDINGS: The lung bases are free of acute infiltrate or sizable effusion. The liver, gallbladder, spleen, adrenal glands and pancreas are within normal limits. The kidneys are well visualized bilaterally and demonstrate a normal enhancement pattern. Normal excretion of contrast material is seen.  The appendix and right ovary have been removed. Uterus is deviated to the right and there is evidence of loculated appearing fluid along the lateral aspect of the  uterus on the left best seen on image number 76 of series 2. Adjacent to this is a considerable amount of inflammatory change as well as multiloculated fluid collection which may be related to the left ovary although could be adjacent to the left ovary. This measures approximately 3.8 x 5.5 cm. To the right of the uterus best seen on image number 76 of series to there is a second fluid collection identified which may be related to the recent surgery. It measures approximately 3.8 by 1.8 cm. These changes may represent superinfection from the recent surgery and ruptured appendix although may be related to subsequent tubo-ovarian abscess on the left.  There remains significant inflammatory change of a loop of small bowel coursing anterior to this as has been present on multiple previous exams. The bladder is well distended. No bony abnormality is seen.  IMPRESSION: Changes in the pelvis consistent with significant inflammatory change and likely abscess formation on the left. It is uncertain whether this represents a tubo-ovarian abscess or some superinfection postoperatively. Surgical consultation is recommended.  These results were called by telephone at the time of interpretation on 12/19/2014 at 4:08 pm to Zenovia Jarred, MD, who verbally acknowledged these results.   Electronically Signed   By: Inez Catalina M.D.   On: 12/19/2014 16:06    Medications: . heparin  5,000 Units Subcutaneous 3 times per day  . piperacillin-tazobactam (ZOSYN)  IV  3.375 g Intravenous Q8H    Assessment/Plan 1. Complex right and left intraabdominal abscesses, involving or adjacent to left ovary 2. Hx of Recurrent appendicitis, right hydrosalpinx 11/28/14 with Diagnostic laparoscopy, peritoneal washings for  culture, laparotomy, appendectomy, right salpingectomy; Dr. Fanny Skates and Dr. Janie Morning 3. Malnutrition with prealbumin 9.7 4. Borderline coagulation studies/Normal LFT's 5.  Antibiotics:  Day 2.5 of Zosyn 6.   DVT:  Heparin/SCD  Plan:  Regular diet, continue antibiotics, discuss plans with Dr. Barry Dienes.        LOS: 2 days    Katrina Deleon 12/21/2014

## 2014-12-21 NOTE — Consult Note (Signed)
Chief Complaint: Patient was seen in consultation today for CT guided aspiration/possible drainage of left pelvic fluid collection. Chief Complaint  Patient presents with  . Abdominal Pain    Referring Physician(s): Byerly,F  History of Present Illness: Katrina Deleon is a 39 y.o. female with history of recurrent appendicitis/right hydrosalpinx on 11/28/2014, status post laparoscopy, peritoneal washings, laparotomy, appendectomy, and right salpingectomy. Pathology revealed acute appendicitis with perforation and serositis, a benign right fallopian tube with serositis and marked edema. Peritoneal fluid cultures revealed Escherichia coli and pseudomonas. Patient was readmitted on 7/26 with abdominal pain, leukocytosis and fever. Follow-up CT scan of abdomen and pelvis revealed changes consistent with significant inflammatory process and likely abscess in the left pelvis, possibly tubo-ovarian or postop infection. Transvaginal ultrasound on 7/27 revealed complex fluid collection in the left pelvis predominantly medial to the left ovary as well as an elongated and septated fluid collection in the adnexal region. Request has now been received for CT guided aspiration and possible drainage of the above-mentioned pelvic fluid collection(s).  Past Medical History  Diagnosis Date  . Fibroadenoma of breast 2003  . Ulcer   . Cancer     Past Surgical History  Procedure Laterality Date  . Breast excisional biopsy  2003    benign  . Laparoscopic appendectomy N/A 07/12/2014    Procedure: LAPAROSCOPIC EXPLORATION AND DRAINAGE OF WHITE PELVIC ABCESS WITH  INSERTION OF DRAIN;  Surgeon: Alphonsa Overall, MD;  Location: [facility];  Service: General;  Laterality: N/A;  . Laparoscopic abdominal exploration N/A 11/28/2014    Procedure: LAPAROSCOPY,LAPAROTOMY,APPENDECTOMY,RIGHT SALPINGECTOMY;  Surgeon: Fanny Skates, MD;  Location: [facility];  Service: General;  Laterality: N/A;     Allergies: Other  Medications: Prior to Admission medications   Medication Sig Start Date End Date Taking? Authorizing Provider  Fish Oil OIL Take 5 mLs by mouth daily.   Yes Historical Provider, MD  ibuprofen (ADVIL,MOTRIN) 200 MG tablet Take 400 mg by mouth every 4 (four) hours as needed for headache or moderate pain.    Yes Historical Provider, MD  Multiple Minerals-Vitamins (CALCIUM & VIT D3 BONE HEALTH) LIQD Take 5 mLs by mouth daily.   Yes Historical Provider, MD  amoxicillin-clavulanate (AUGMENTIN) 875-125 MG per tablet Take 1 tablet by mouth 2 (two) times daily. Patient not taking: Reported on 06/09/7260 0/35/59   Leighton Ruff, MD  Beclomethasone Dipropionate 80 MCG/ACT AERS Place 2 sprays into the nose daily. Patient not taking: Reported on 11/27/2014 09/27/12   Marcial Pacas, DO  HYDROcodone-acetaminophen (NORCO/VICODIN) 5-325 MG per tablet Take 1 tablet by mouth every 4 (four) hours as needed for moderate pain. Patient not taking: Reported on 7/41/6384 5/36/46   Leighton Ruff, MD     Family History  Problem Relation Age of Onset  . Breast cancer Mother   . Breast cancer Paternal Grandmother   . Uterine cancer Maternal Grandmother     History   Social History  . Marital Status: Married    Spouse Name: N/A  . Number of Children: N/A  . Years of Education: N/A   Occupational History  . accountant    Social History Main Topics  . Smoking status: Never Smoker   . Smokeless tobacco: Never Used  . Alcohol Use: 0.5 oz/week    1 Standard drinks or equivalent per week     Comment: socially   . Drug Use: No  . Sexual Activity:    Partners: Male    Patent examiner Protection: None  Other Topics Concern  . None   Social History Narrative      Review of Systems see above; patient's primary complaint today is persistent left lower quadrant discomfort  Vital Signs: BP 103/56 mmHg  Pulse 59  Temp(Src) 98.2 F (36.8 C) (Oral)  Resp 18  Ht 5\' 10"  (1.778 m)  Wt  141 lb 5 oz (64.1 kg)  BMI 20.28 kg/m2  SpO2 100%  LMP 11/13/2014  Physical Exam patient awake, alert; chest clear to auscultation bilaterally; heart with regular rate and rhythm; abdomen soft, positive bowel sounds, open midline wound cover with gauze dressing, pelvic tenderness left greater than right. Extremities with full range of motion and no edema.  Mallampati Score:     Imaging: US Transvaginal Non-ob  12/20/2014   CLINICAL DATA:  Status post appendectomy and right salpingectomy on 11/28/2014. Post- operative CT has shown a loculated fluid collection in the central and left pelvis.  EXAM: ULTRASOUND PELVIS TRANSVAGINAL  TECHNIQUE: Transvaginal ultrasound examination of the pelvis was performed including evaluation of the uterus, ovaries, adnexal regions, and pelvic cul-de-sac.  COMPARISON:  CT of the abdomen and pelvis on 12/19/2014  FINDINGS: Uterus  Measurements: 9.4 x 3.9 x 3.8 cm. No fibroids or other mass visualized.  Endometrium  Thickness: 10 mm.  No focal abnormality visualized.  Right ovary  Measurements: The right ovary is visualized and measures approximately 3.9 x 2.2 x 2.6 cm. Normal follicles are present. There is a small amount of simple appearing fluid adjacent to the right ovary.  Left ovary  Measurements: The left every measures approximately 3.4 x 1.8 x 2.1 cm. Normal follicles are visualized. There is complex fluid predominantly medial to the left ovary and an elongated area of septated fluid in the adnexal region. The more elongated portion may represent fluid and a dilated fallopian tube. A more complex portion at is more round in shape measures approximately 3.7 cm in greatest diameter.  IMPRESSION: Normal appearance of right and left ovaries. Complex fluid in the left pelvis is predominantly medial to the left ovary. Elongated and septated fluid in the adnexal region may be a second area of fluid versus a dilated fallopian tube.   Electronically Signed   By: Aletta Edouard M.D.   On: 12/20/2014 15:49   Ct Abdomen Pelvis W Contrast  12/19/2014   CLINICAL DATA:  Status post appendectomy with bilateral flank pain for 1 week  EXAM: CT ABDOMEN AND PELVIS WITH CONTRAST  TECHNIQUE: Multidetector CT imaging of the abdomen and pelvis was performed using the standard protocol following bolus administration of intravenous contrast.  CONTRAST:  163mL OMNIPAQUE IOHEXOL 300 MG/ML  SOLN  COMPARISON:  11/27/2014  FINDINGS: The lung bases are free of acute infiltrate or sizable effusion. The liver, gallbladder, spleen, adrenal glands and pancreas are within normal limits. The kidneys are well visualized bilaterally and demonstrate a normal enhancement pattern. Normal excretion of contrast material is seen.  The appendix and right ovary have been removed. Uterus is deviated to the right and there is evidence of loculated appearing fluid along the lateral aspect of the uterus on the left best seen on image number 76 of series 2. Adjacent to this is a considerable amount of inflammatory change as well as multiloculated fluid collection which may be related to the left ovary although could be adjacent to the left ovary. This measures approximately 3.8 x 5.5 cm. To the right of the uterus best seen on image number 76 of series to there  is a second fluid collection identified which may be related to the recent surgery. It measures approximately 3.8 by 1.8 cm. These changes may represent superinfection from the recent surgery and ruptured appendix although may be related to subsequent tubo-ovarian abscess on the left.  There remains significant inflammatory change of a loop of small bowel coursing anterior to this as has been present on multiple previous exams. The bladder is well distended. No bony abnormality is seen.  IMPRESSION: Changes in the pelvis consistent with significant inflammatory change and likely abscess formation on the left. It is uncertain whether this represents a tubo-ovarian  abscess or some superinfection postoperatively. Surgical consultation is recommended.  These results were called by telephone at the time of interpretation on 12/19/2014 at 4:08 pm to Zenovia Jarred, MD, who verbally acknowledged these results.   Electronically Signed   By: Inez Catalina M.D.   On: 12/19/2014 16:06   Ct Abdomen Pelvis W Contrast  11/27/2014   CLINICAL DATA:  Right lower quadrant pain.  EXAM: CT ABDOMEN AND PELVIS WITH CONTRAST  TECHNIQUE: Multidetector CT imaging of the abdomen and pelvis was performed using the standard protocol following bolus administration of intravenous contrast.  CONTRAST:  1101mL OMNIPAQUE IOHEXOL 300 MG/ML  SOLN  COMPARISON:  07/28/2014  FINDINGS: Lung bases are clear.  No effusions.  Heart is normal size.  Liver, spleen, pancreas, gallbladder, adrenals and kidneys are unremarkable.  There is moderate free fluid around the liver and spleen as well as in the paracolic gutters and pelvis. Appendix is visualized with a diameter of 9 mm and suggestion of mucosal enhancement. Small amount a gas is noted near the tip of the appendix.  Uterus is unremarkable. Cystic areas noted in the right adnexa could reflect small ovarian cysts or hydrosalpinx.  Small bowel loops are mildly prominent throughout the abdomen and pelvis. Several loops of small bowel in the pelvis appears thick walled. This could reflect enteritis or secondary inflammation. Large bowel unremarkable. Aorta is normal caliber. No free air or adenopathy.  IMPRESSION: Appendix is mildly prominent, measuring 9 mm in diameter with mucosal enhancement. There is moderate free fluid in the abdomen and pelvis. Given the history of ruptured appendicitis, I cannot exclude recurrent ruptured appendicitis. Overall appearance similar to prior study although the free fluid is larger on today's study.  Areas of small bowel wall thickening in the pelvis could be related to primary enteritis or secondary inflammation.  Small cystic  areas within the right adnexa could reflect ovarian cysts or hydrosalpinx.   Electronically Signed   By: Rolm Baptise M.D.   On: 11/27/2014 09:06    Labs:  CBC:  Recent Labs  12/03/14 0458 12/19/14 1409 12/20/14 0525 12/21/14 0900  WBC 6.1 16.8* 9.7 5.2  HGB 10.2* 11.7* 9.6* 9.2*  HCT 30.0* 35.1* 29.4* 28.5*  PLT 218 278 202 190    COAGS:  Recent Labs  07/11/14 1300 07/12/14 0540 12/20/14 0525  INR  --  1.90* 1.25  APTT 35  --  42*    BMP:  Recent Labs  12/02/14 0510 12/19/14 1409 12/20/14 0525 12/21/14 0900  NA 137 136 139 139  K 3.6 3.4* 3.8 3.7  CL 105 103 109 110  CO2 26 24 24 24   GLUCOSE 98 95 96 98  BUN <5* 11 7 7   CALCIUM 7.9* 8.9 8.5* 8.3*  CREATININE 0.72 0.82 0.67 0.75  GFRNONAA >60 >60 >60 >60  GFRAA >60 >60 >60 >60    LIVER FUNCTION  TESTS:  Recent Labs  07/05/14 1730 07/10/14 1637 11/27/14 0632 12/19/14 1409  BILITOT 1.0 0.8 1.0 0.8  AST 17 18 17 17   ALT 15 19 16 28   ALKPHOS 41 59 41 60  PROT 8.2 7.9 6.8 7.6  ALBUMIN 4.5 3.9 3.9 3.5    TUMOR MARKERS: No results for input(s): AFPTM, CEA, CA199, CHROMGRNA in the last 8760 hours.  Assessment and Plan: Patient with history of recurrent appendicitis, right hydrosalpinx 11/28/14, status post laparoscopy with peritoneal washings, laparotomy, appendectomy, right salpingectomy, prior positive peritoneal fluid cultures revealing Escherichia coli and pseudomonas; now with CT evidence of left pelvic fluid collection/abscess; patient currently afebrile with normal white blood cell count, on IV Zosyn. Request has been received for CT guided aspiration and possible drainage of the above-mentioned left pelvic fluid collection. Imaging studies have been reviewed and details/risks of procedure, including but not limited to, internal bleeding, worsening infection, injury to adjacent structures, need for emergent surgery, inability to aspirate or drain collection discussed with patient and husband in detail  with their understanding and consent. Procedure to reschedule for 7/29.   Thank you for this interesting consult.  I greatly enjoyed meeting [place] and look forward to participating in their care.  A copy of this report was sent to the requesting provider on this date.  Signed: D. Rowe Robert 12/21/2014, 4:33 PM   I spent a total of 40 minutes in face to face in clinical consultation, greater than 50% of which was counseling/coordinating care for CT-guided aspiration possible drainage of left pelvic fluid collection.

## 2014-12-22 ENCOUNTER — Inpatient Hospital Stay (HOSPITAL_COMMUNITY): Payer: Managed Care, Other (non HMO)

## 2014-12-22 ENCOUNTER — Encounter (HOSPITAL_COMMUNITY): Payer: Self-pay | Admitting: Radiology

## 2014-12-22 LAB — CBC
HCT: 27.2 % — ABNORMAL LOW (ref 36.0–46.0)
HEMOGLOBIN: 9 g/dL — AB (ref 12.0–15.0)
MCH: 30 pg (ref 26.0–34.0)
MCHC: 33.1 g/dL (ref 30.0–36.0)
MCV: 90.7 fL (ref 78.0–100.0)
PLATELETS: 204 10*3/uL (ref 150–400)
RBC: 3 MIL/uL — ABNORMAL LOW (ref 3.87–5.11)
RDW: 14.5 % (ref 11.5–15.5)
WBC: 5.3 10*3/uL (ref 4.0–10.5)

## 2014-12-22 MED ORDER — MIDAZOLAM HCL 2 MG/2ML IJ SOLN
INTRAMUSCULAR | Status: AC | PRN
  Administered 2014-12-22 (×4): 1 mg via INTRAVENOUS

## 2014-12-22 MED ORDER — FENTANYL CITRATE (PF) 100 MCG/2ML IJ SOLN
INTRAMUSCULAR | Status: AC | PRN
  Administered 2014-12-22: 50 ug via INTRAVENOUS

## 2014-12-22 MED ORDER — MIDAZOLAM HCL 2 MG/2ML IJ SOLN
INTRAMUSCULAR | Status: AC
  Filled 2014-12-22: qty 6

## 2014-12-22 MED ORDER — FENTANYL CITRATE (PF) 100 MCG/2ML IJ SOLN
INTRAMUSCULAR | Status: AC
  Filled 2014-12-22: qty 4

## 2014-12-22 NOTE — Clinical Documentation Improvement (Addendum)
Malnutrition documented in current chart.  Please identify and document the severity of malnutrition (mild, moderate, severe), if known, in your future progress notes and discharge summary.   Height documented at 5'10", weight 141 lb 5 oz, BMI 20.28.    Thank you, Mateo Flow, RN [phone] Clinical Documentation Specialist

## 2014-12-22 NOTE — Progress Notes (Signed)
Ok after procedure in IR having some pain and numbness left buttocks, good sensation and motion LLE, good distal pulses with good distal pulse.  I have let IR know, we will give her some morphine for now and see how she does, she had trouble with standing secondary to pain.

## 2014-12-22 NOTE — Clinical Documentation Improvement (Signed)
Please identify and document in your future progress notes any clinical conditions associated with the abnormal lab values below.    Possible Clinical Conditions: -Katrina Deleon (if present, please specify acuity and type) -Other condition (please specify) -Unable to determine at present  Component      RBC Hemoglobin HCT  Latest Ref Rng      3.87 - 5.11 MIL/uL 12.0 - 15.0 g/dL 36.0 - 46.0 %  12/19/2014     2:09 PM 3.90 11.7 (L) 35.1 (L)  12/20/2014      3.31 (L) 9.6 (L) 29.4 (L)  12/21/2014      3.17 (L) 9.2 (L) 28.5 (L)  12/22/2014      3.00 (L) 9.0 (L) 27.2 (L)   Thank you, Mateo Flow, RN [phone] Clinical Documentation Specialist

## 2014-12-22 NOTE — Procedures (Signed)
Technically successful CT guided drainage catheter placement into pelvis via L TG approach yielding 10 cc of dark red fluid.  Samples sent to laboratory.  No immediate post procedural complications.

## 2014-12-22 NOTE — Progress Notes (Signed)
ANTIBIOTIC CONSULT NOTE - follow up  Pharmacy Consult for Zosyn Indication: Intra-abdominal infection  Allergies  Allergen Reactions  . Other     Walnuts, pecans     Patient Measurements: Height: 5\' 10"  (177.8 cm) Weight: 141 lb 5 oz (64.1 kg) IBW/kg (Calculated) : 68.5  Vital Signs: Temp: 98.2 F (36.8 C) (07/29 0533) Temp Source: Oral (07/29 0533) BP: 99/57 mmHg (07/29 0533) Pulse Rate: 57 (07/29 0533) Intake/Output from previous day: 07/28 0701 - 07/29 0700 In: 150 [IV Piggyback:150] Out: -  Intake/Output from this shift:    Labs:  Recent Labs  12/19/14 1409 12/20/14 0525 12/21/14 0900 12/22/14 0520  WBC 16.8* 9.7 5.2 5.3  HGB 11.7* 9.6* 9.2* 9.0*  PLT 278 202 190 204  CREATININE 0.82 0.67 0.75  --    Estimated Creatinine Clearance: 97.4 mL/min (by C-G formula based on Cr of 0.75). No results for input(s): VANCOTROUGH, VANCOPEAK, VANCORANDOM, GENTTROUGH, GENTPEAK, GENTRANDOM, TOBRATROUGH, TOBRAPEAK, TOBRARND, AMIKACINPEAK, AMIKACINTROU, AMIKACIN in the last 72 hours.   Microbiology: Recent Results (from the past 720 hour(s))  Wet prep, genital     Status: Abnormal   Collection Time: 11/27/14  8:56 AM  Result Value Ref Range Status   Yeast Wet Prep HPF POC NONE SEEN NONE SEEN Final   Trich, Wet Prep NONE SEEN NONE SEEN Final   Clue Cells Wet Prep HPF POC NONE SEEN NONE SEEN Final   WBC, Wet Prep HPF POC FEW (A) NONE SEEN Final  Surgical pcr screen     Status: None   Collection Time: 11/28/14  7:51 AM  Result Value Ref Range Status   MRSA, PCR NEGATIVE NEGATIVE Final   Staphylococcus aureus NEGATIVE NEGATIVE Final    Comment:        The Xpert SA Assay (FDA approved for NASAL specimens in patients over 38 years of age), is one component of a comprehensive surveillance program.  Test performance has been validated by [facility] for patients greater than or equal to 38 year old. It is not intended to diagnose infection nor to guide or monitor  treatment.   Culture, body fluid-bottle     Status: None   Collection Time: 11/28/14 10:19 AM  Result Value Ref Range Status   Specimen Description FLUID PERITONEAL  Final   Special Requests NONE  Final   Gram Stain   Final    ANAEROBIC BOTTLE ONLY GRAM NEGATIVE RODS CRITICAL RESULT CALLED TO, READ BACK BY AND VERIFIED WITH: S.WRIGHT,RN [id number] 11/30/14 M.CAMPBELL GRAM STAIN REVIEWED-AGREE WITH RESULT R GREEN AEROBIC BOTTLE ONLY GRAM NEGATIVE RODS    Culture   Final    ESCHERICHIA COLI PSEUDOMONAS AERUGINOSA POSSIBLE ANAEROBIC GRAM POSITIVE COCCI PRESENT BUT UNABLE TO ISOLATE TO IDENTIFY Performed at [facility]    Report Status 12/05/2014 FINAL  Final   Organism ID, Bacteria ESCHERICHIA COLI  Final   Organism ID, Bacteria PSEUDOMONAS AERUGINOSA  Final      Susceptibility   Escherichia coli - MIC*    AMPICILLIN <=2 SENSITIVE Sensitive     CEFAZOLIN <=4 SENSITIVE Sensitive     CEFEPIME <=1 SENSITIVE Sensitive     CEFTAZIDIME <=1 SENSITIVE Sensitive     CEFTRIAXONE <=1 SENSITIVE Sensitive     CIPROFLOXACIN <=0.25 SENSITIVE Sensitive     GENTAMICIN <=1 SENSITIVE Sensitive     IMIPENEM <=0.25 SENSITIVE Sensitive     TRIMETH/SULFA <=20 SENSITIVE Sensitive     AMPICILLIN/SULBACTAM <=2 SENSITIVE Sensitive     PIP/TAZO <=4 SENSITIVE Sensitive     *  ESCHERICHIA COLI   Pseudomonas aeruginosa - MIC*    CEFTAZIDIME 4 SENSITIVE Sensitive     CIPROFLOXACIN <=0.25 SENSITIVE Sensitive     GENTAMICIN <=1 SENSITIVE Sensitive     IMIPENEM 2 SENSITIVE Sensitive     PIP/TAZO 8 SENSITIVE Sensitive     CEFEPIME 2 SENSITIVE Sensitive     * PSEUDOMONAS AERUGINOSA  Gram stain     Status: None   Collection Time: 11/28/14 10:19 AM  Result Value Ref Range Status   Specimen Description FLUID PERITONEAL  Final   Special Requests NONE  Final   Gram Stain   Final    ABUNDANT WBC PRESENT,BOTH PMN AND MONONUCLEAR NO ORGANISMS SEEN Performed at [facility]    Report Status 11/28/2014  FINAL  Final  Culture, blood (routine x 2)     Status: None (Preliminary result)   Collection Time: 12/19/14  5:57 PM  Result Value Ref Range Status   Specimen Description BLOOD RIGHT ANTECUBITAL  Final   Special Requests BOTTLES DRAWN AEROBIC AND ANAEROBIC 5CC EA  Final   Culture   Final    NO GROWTH 2 DAYS Performed at [facility]    Report Status PENDING  Incomplete  Culture, blood (routine x 2)     Status: None (Preliminary result)   Collection Time: 12/19/14  6:04 PM  Result Value Ref Range Status   Specimen Description BLOOD LEFT HAND  Final   Special Requests BOTTLES DRAWN AEROBIC AND ANAEROBIC 5CC EA  Final   Culture   Final    NO GROWTH 2 DAYS Performed at [facility]    Report Status PENDING  Incomplete    Medical History: Past Medical History  Diagnosis Date  . Fibroadenoma of breast 2003  . Ulcer   . Cancer     Assessment:  39 female with complaint of fever at home (101F) and abdominal pain.  S/P appendectomy on 11/28/14  Complex right and left intraabdominal abscesses, involving or adjacent to left ovary  Pharmacy consulted to dose Zosyn for intraabdominal abscesses  Goal of Therapy:  Eradication of suspected infection  Plan:  -Continue Zosyn 3.375gm IV q8h (each dose infused over 4 hrs) -Pharmacy will sign off formal note writing as current dose is appropriate for indication and renal function, will follow peripherally.  Dolly Rias RPh 12/22/2014, 8:52 AM Pager [phone]

## 2014-12-22 NOTE — Progress Notes (Signed)
[facility] Progress Note     Subjective: C/o cramping pain.  No N/V, NPO for procedure.  Having flatus and BM's.  Ambulating OOB.  Anxiously awaiting IR procedure.  She knows it may not be able to be done.    Objective: Vital signs in last 24 hours: Temp:  [98.2 F (36.8 C)-98.6 F (37 C)] 98.2 F (36.8 C) (07/29 0533) Pulse Rate:  [52-60] 57 (07/29 0533) Resp:  [18] 18 (07/29 0533) BP: (92-103)/(50-57) 99/57 mmHg (07/29 0533) SpO2:  [99 %-100 %] 100 % (07/29 0533) Last BM Date: 12/19/14  Intake/Output from previous day: 07/28 0701 - 07/29 0700 In: 150 [IV Piggyback:150] Out: -  Intake/Output this shift:    PE: Gen:  Alert, NAD, pleasant Abd: Soft, tenderness in RLQ and LLQ, mild distension, +BS, no HSM, midline wound is narrow with beefy red granulation tissue  Lab Results:   Recent Labs  12/21/14 0900 12/22/14 0520  WBC 5.2 5.3  HGB 9.2* 9.0*  HCT 28.5* 27.2*  PLT 190 204   BMET  Recent Labs  12/20/14 0525 12/21/14 0900  NA 139 139  K 3.8 3.7  CL 109 110  CO2 24 24  GLUCOSE 96 98  BUN 7 7  CREATININE 0.67 0.75  CALCIUM 8.5* 8.3*   PT/INR  Recent Labs  12/20/14 0525  LABPROT 15.8*  INR 1.25   CMP     Component Value Date/Time   NA 139 12/21/2014 0900   K 3.7 12/21/2014 0900   CL 110 12/21/2014 0900   CO2 24 12/21/2014 0900   GLUCOSE 98 12/21/2014 0900   BUN 7 12/21/2014 0900   CREATININE 0.75 12/21/2014 0900   CREATININE 0.98 06/09/2012 1017   CALCIUM 8.3* 12/21/2014 0900   PROT 7.6 12/19/2014 1409   ALBUMIN 3.5 12/19/2014 1409   AST 17 12/19/2014 1409   ALT 28 12/19/2014 1409   ALKPHOS 60 12/19/2014 1409   BILITOT 0.8 12/19/2014 1409   GFRNONAA >60 12/21/2014 0900   GFRAA >60 12/21/2014 0900   Lipase     Component Value Date/Time   LIPASE 25 12/19/2014 1409       Studies/Results: US Transvaginal Non-ob  12/20/2014   CLINICAL DATA:  Status post appendectomy and right salpingectomy on 11/28/2014. Post-  operative CT has shown a loculated fluid collection in the central and left pelvis.  EXAM: ULTRASOUND PELVIS TRANSVAGINAL  TECHNIQUE: Transvaginal ultrasound examination of the pelvis was performed including evaluation of the uterus, ovaries, adnexal regions, and pelvic cul-de-sac.  COMPARISON:  CT of the abdomen and pelvis on 12/19/2014  FINDINGS: Uterus  Measurements: 9.4 x 3.9 x 3.8 cm. No fibroids or other mass visualized.  Endometrium  Thickness: 10 mm.  No focal abnormality visualized.  Right ovary  Measurements: The right ovary is visualized and measures approximately 3.9 x 2.2 x 2.6 cm. Normal follicles are present. There is a small amount of simple appearing fluid adjacent to the right ovary.  Left ovary  Measurements: The left every measures approximately 3.4 x 1.8 x 2.1 cm. Normal follicles are visualized. There is complex fluid predominantly medial to the left ovary and an elongated area of septated fluid in the adnexal region. The more elongated portion may represent fluid and a dilated fallopian tube. A more complex portion at is more round in shape measures approximately 3.7 cm in greatest diameter.  IMPRESSION: Normal appearance of right and left ovaries. Complex fluid in the left pelvis is predominantly medial to the left ovary. Elongated  and septated fluid in the adnexal region may be a second area of fluid versus a dilated fallopian tube.   Electronically Signed   By: Aletta Edouard M.D.   On: 12/20/2014 15:49    Anti-infectives: Anti-infectives    Start     Dose/Rate Route Frequency Ordered Stop   12/20/14 0200  piperacillin-tazobactam (ZOSYN) IVPB 3.375 g     3.375 g 12.5 mL/hr over 240 Minutes Intravenous Every 8 hours 12/19/14 1745     12/19/14 1745  piperacillin-tazobactam (ZOSYN) IVPB 3.375 g     3.375 g 100 mL/hr over 30 Minutes Intravenous STAT 12/19/14 1744 12/19/14 1837       Assessment/Plan Complex right and left intraabdominal abscesses, involving or adjacent to left  ovary -NPO for possible IR procedure for larger left sided abscess -IV antibiotics -GYN and IR following -Ambulate and IS Hx of Recurrent appendicitis, right hydrosalpinx 11/28/14 with Diagnostic laparoscopy, peritoneal washings for culture, laparotomy, appendectomy, right salpingectomy; Dr. Fanny Skates and Dr. Janie Morning PCM - prealbumin 9.7 Borderline coagulation studies/Normal LFT's Antibiotics: Day 3.5 of Zosyn DVT: Heparin/SCD      LOS: 3 days    Nat Christen 12/22/2014, 7:58 AM Pager: [phone]

## 2014-12-23 NOTE — Progress Notes (Signed)
Patient ID: Katrina Deleon, female   DOB: [birth year], 39 y.o.   MRN: [id number] [facility] Progress Note:   * No surgery found *  Subjective: Mental status is clear.   Objective: Vital signs in last 24 hours: Temp:  [98 F (36.7 C)-99 F (37.2 C)] 99 F (37.2 C) (07/30 1100) Pulse Rate:  [58-88] 63 (07/30 1100) Resp:  [15-25] 16 (07/30 1100) BP: (91-116)/(47-60) 104/57 mmHg (07/30 1100) SpO2:  [99 %-100 %] 100 % (07/30 1100)  Intake/Output from previous day: 07/29 0701 - 07/30 0700 In: 4850 [I.V.:4700; IV Piggyback:150] Out: 40 [Drains:40] Intake/Output this shift: Total I/O In: -  Out: 10 [Drains:10]  Physical Exam: Work of breathing is normal  Lab Results:  Results for orders placed or performed during the hospital encounter of 12/19/14 (from the past 48 hour(s))  CBC     Status: Abnormal   Collection Time: 12/22/14  5:20 AM  Result Value Ref Range   WBC 5.3 4.0 - 10.5 K/uL   RBC 3.00 (L) 3.87 - 5.11 MIL/uL   Hemoglobin 9.0 (L) 12.0 - 15.0 g/dL   HCT 27.2 (L) 36.0 - 46.0 %   MCV 90.7 78.0 - 100.0 fL   MCH 30.0 26.0 - 34.0 pg   MCHC 33.1 30.0 - 36.0 g/dL   RDW 14.5 11.5 - 15.5 %   Platelets 204 150 - 400 K/uL  Culture, routine-abscess     Status: None (Preliminary result)   Collection Time: 12/22/14  1:40 PM  Result Value Ref Range   Specimen Description DRAINAGE    Special Requests Normal    Gram Stain      NO WBC SEEN NO SQUAMOUS EPITHELIAL CELLS SEEN NO ORGANISMS SEEN Performed at [facility]    Culture PENDING    Report Status PENDING     Radiology/Results: Ct Image Guided Drainage By Percutaneous Catheter  12/22/2014   INDICATION: Post appendectomy with pelvic and flank pain. CT scan performed 12/19/2014 demonstrates indeterminate fluid collections within the bilateral adnexa, left greater than right. Subsequent pelvic ultrasound confirms the complex fluid collection within the left hemipelvis is separate from the left ovary.  Given patient's persistent pelvic pain, request made for attempted placement of a CT-guided percutaneous drainage catheter into the dominant indeterminate fluid collection within the left hemipelvis.  EXAM: CT IMAGE GUIDED DRAINAGE BY PERCUTANEOUS CATHETER  COMPARISON:  CT abdomen pelvis- 12/19/2014; 11/27/2014; pelvic ultrasound- 12/20/2014  MEDICATIONS: The patient is currently admitted to the hospital and receiving intravenous antibiotics. The antibiotics were administered within an appropriate time frame prior to the initiation of the procedure.  ANESTHESIA/SEDATION: Fentanyl 50 mcg IV; Versed 4 mg IV  Total Moderate Sedation time  21 minutes  CONTRAST:  None  COMPLICATIONS: None immediate  PROCEDURE: Informed written consent was obtained from the patient after a discussion of the risks, benefits and alternatives to treatment. The patient was placed prone on the CT gantry and a pre procedural CT was performed re-demonstrating the known common abscess/fluid collection within the left adnexal with dominant cystic component measuring approximately 5.2 x 4.7 cm (image 14, series 2). The procedure was planned. A timeout was performed prior to the initiation of the procedure.  The left buttocks was prepped and draped in the usual sterile fashion. The overlying soft tissues were anesthetized with 1% lidocaine with epinephrine. Appropriate trajectory was planned with the use of a 22 gauge spinal needle utilizing a left trans gluteal approach. An 18 gauge trocar needle was advanced into  the abscess/fluid collection and a short Amplatz super stiff wire was coiled within the collection. Appropriate positioning was confirmed with a limited CT scan. The tract was serially dilated allowing placement of a 10 [place] all-purpose drainage catheter. Appropriate positioning was confirmed with a limited postprocedural CT scan.  Approximately 10 cc of dark red fluid was aspirated. The tube was connected to a JP bulb and sutured in  place. A dressing was placed. The patient tolerated the procedure well without immediate post procedural complication.  IMPRESSION: Successful CT guided placement of a 10 French all purpose drain catheter into the left hemipelvis via left trans gluteal approach with aspiration of 10 mL of dark red fluid. Samples were sent to the laboratory as requested by the ordering clinical team.   Electronically Signed   By: Sandi Mariscal M.D.   On: 12/22/2014 15:28    Anti-infectives: Anti-infectives    Start     Dose/Rate Route Frequency Ordered Stop   12/20/14 0200  piperacillin-tazobactam (ZOSYN) IVPB 3.375 g     3.375 g 12.5 mL/hr over 240 Minutes Intravenous Every 8 hours 12/19/14 1745     12/19/14 1745  piperacillin-tazobactam (ZOSYN) IVPB 3.375 g     3.375 g 100 mL/hr over 30 Minutes Intravenous STAT 12/19/14 1744 12/19/14 1837      Assessment/Plan: Problem List: Patient Active Problem List   Diagnosis Date Noted  . Intra-abdominal abscess 12/19/2014  . Hydrosalpinx s/p right RSO 11/28/2014 12/03/2014  . Abdominal pain 11/27/2014  . Ruptured appendicitis s/p lap appy 11/28/2014 07/14/2014  . Pyrexia 07/10/2014  . Right lower quadrant abdominal abscess 07/10/2014  . Infertility, female 06/29/2012  . Family history of breast cancer in mother 06/29/2012    JP drain with bloody fluid.  Continue abx and drainage * No surgery found *    LOS: 4 days   Matt B. Hassell Done, MD, [facility] [phone] beeper [phone]  12/23/2014 12:38 PM

## 2014-12-23 NOTE — Progress Notes (Signed)
Patient ID: Katrina Deleon, female   DOB: [birth year], 39 y.o.   MRN: [id number] [facility] Progress Note:   * No surgery found *  Subjective: Mental status is clear.  Frustrated with a lot of questions Objective: Vital signs in last 24 hours: Temp:  [98 F (36.7 C)-98.9 F (37.2 C)] 98 F (36.7 C) (07/30 6629) Pulse Rate:  [58-88] 58 (07/30 0608) Resp:  [15-25] 15 (07/30 4765) BP: (91-116)/(47-61) 91/49 mmHg (07/30 0608) SpO2:  [99 %-100 %] 100 % (07/30 4650)  Intake/Output from previous day: 07/29 0701 - 07/30 0700 In: 4850 [I.V.:4700; IV Piggyback:150] Out: 40 [Drains:40] Intake/Output this shift:    Physical Exam: Work of breathing is not labored  Lab Results:  Results for orders placed or performed during the hospital encounter of 12/19/14 (from the past 48 hour(s))  CBC     Status: Abnormal   Collection Time: 12/22/14  5:20 AM  Result Value Ref Range   WBC 5.3 4.0 - 10.5 K/uL   RBC 3.00 (L) 3.87 - 5.11 MIL/uL   Hemoglobin 9.0 (L) 12.0 - 15.0 g/dL   HCT 27.2 (L) 36.0 - 46.0 %   MCV 90.7 78.0 - 100.0 fL   MCH 30.0 26.0 - 34.0 pg   MCHC 33.1 30.0 - 36.0 g/dL   RDW 14.5 11.5 - 15.5 %   Platelets 204 150 - 400 K/uL  Culture, routine-abscess     Status: None (Preliminary result)   Collection Time: 12/22/14  1:40 PM  Result Value Ref Range   Specimen Description DRAINAGE    Special Requests Normal    Gram Stain      NO WBC SEEN NO SQUAMOUS EPITHELIAL CELLS SEEN NO ORGANISMS SEEN Performed at [facility]    Culture PENDING    Report Status PENDING     Radiology/Results: Ct Image Guided Drainage By Percutaneous Catheter  12/22/2014   INDICATION: Post appendectomy with pelvic and flank pain. CT scan performed 12/19/2014 demonstrates indeterminate fluid collections within the bilateral adnexa, left greater than right. Subsequent pelvic ultrasound confirms the complex fluid collection within the left hemipelvis is separate from the left ovary.  Given patient's persistent pelvic pain, request made for attempted placement of a CT-guided percutaneous drainage catheter into the dominant indeterminate fluid collection within the left hemipelvis.  EXAM: CT IMAGE GUIDED DRAINAGE BY PERCUTANEOUS CATHETER  COMPARISON:  CT abdomen pelvis- 12/19/2014; 11/27/2014; pelvic ultrasound- 12/20/2014  MEDICATIONS: The patient is currently admitted to the hospital and receiving intravenous antibiotics. The antibiotics were administered within an appropriate time frame prior to the initiation of the procedure.  ANESTHESIA/SEDATION: Fentanyl 50 mcg IV; Versed 4 mg IV  Total Moderate Sedation time  21 minutes  CONTRAST:  None  COMPLICATIONS: None immediate  PROCEDURE: Informed written consent was obtained from the patient after a discussion of the risks, benefits and alternatives to treatment. The patient was placed prone on the CT gantry and a pre procedural CT was performed re-demonstrating the known common abscess/fluid collection within the left adnexal with dominant cystic component measuring approximately 5.2 x 4.7 cm (image 14, series 2). The procedure was planned. A timeout was performed prior to the initiation of the procedure.  The left buttocks was prepped and draped in the usual sterile fashion. The overlying soft tissues were anesthetized with 1% lidocaine with epinephrine. Appropriate trajectory was planned with the use of a 22 gauge spinal needle utilizing a left trans gluteal approach. An 18 gauge trocar needle was advanced into  the abscess/fluid collection and a short Amplatz super stiff wire was coiled within the collection. Appropriate positioning was confirmed with a limited CT scan. The tract was serially dilated allowing placement of a 10 [place] all-purpose drainage catheter. Appropriate positioning was confirmed with a limited postprocedural CT scan.  Approximately 10 cc of dark red fluid was aspirated. The tube was connected to a JP bulb and sutured in  place. A dressing was placed. The patient tolerated the procedure well without immediate post procedural complication.  IMPRESSION: Successful CT guided placement of a 10 French all purpose drain catheter into the left hemipelvis via left trans gluteal approach with aspiration of 10 mL of dark red fluid. Samples were sent to the laboratory as requested by the ordering clinical team.   Electronically Signed   By: Sandi Mariscal M.D.   On: 12/22/2014 15:28    Anti-infectives: Anti-infectives    Start     Dose/Rate Route Frequency Ordered Stop   12/20/14 0200  piperacillin-tazobactam (ZOSYN) IVPB 3.375 g     3.375 g 12.5 mL/hr over 240 Minutes Intravenous Every 8 hours 12/19/14 1745     12/19/14 1745  piperacillin-tazobactam (ZOSYN) IVPB 3.375 g     3.375 g 100 mL/hr over 30 Minutes Intravenous STAT 12/19/14 1744 12/19/14 1837      Assessment/Plan: Problem List: Patient Active Problem List   Diagnosis Date Noted  . Intra-abdominal abscess 12/19/2014  . Hydrosalpinx s/p right RSO 11/28/2014 12/03/2014  . Abdominal pain 11/27/2014  . Ruptured appendicitis s/p lap appy 11/28/2014 07/14/2014  . Pyrexia 07/10/2014  . Right lower quadrant abdominal abscess 07/10/2014  . Infertility, female 06/29/2012  . Family history of breast cancer in mother 06/29/2012    Had percutaneous drainage - concern about fluid collection on the right side.  * No surgery found *    LOS: 4 days   Matt B. Hassell Done, MD, [facility] [phone] beeper [phone]  12/23/2014 11:01 AM .

## 2014-12-23 NOTE — Progress Notes (Signed)
Patient ID: Katrina Deleon, female   DOB: 1975/09/06, 39 y.o.   MRN: [id number]     Referring Physician(s): Byerly  Chief Complaint:  Follow up for CT guided placement of perc drain  Subjective:  Ms Katrina Deleon states she is feeling a little better today and reports less pain at the drain site.  She is not complaining of any numbness today  She had a lot of questions today regarding the drain and the culture results.  I explained her results are still pending and explained the drain still has too much output to remove it at this time.  Allergies: Other  Medications: Prior to Admission medications   Medication Sig Start Date End Date Taking? Authorizing Provider  Fish Oil OIL Take 5 mLs by mouth daily.   Yes Historical Provider, MD  ibuprofen (ADVIL,MOTRIN) 200 MG tablet Take 400 mg by mouth every 4 (four) hours as needed for headache or moderate pain.    Yes Historical Provider, MD  Multiple Minerals-Vitamins (CALCIUM & VIT D3 BONE HEALTH) LIQD Take 5 mLs by mouth daily.   Yes Historical Provider, MD  amoxicillin-clavulanate (AUGMENTIN) 875-125 MG per tablet Take 1 tablet by mouth 2 (two) times daily. Patient not taking: Reported on 11/22/5282 1/32/44   Leighton Ruff, MD  Beclomethasone Dipropionate 80 MCG/ACT AERS Place 2 sprays into the nose daily. Patient not taking: Reported on 11/27/2014 09/27/12   Marcial Pacas, DO  HYDROcodone-acetaminophen (NORCO/VICODIN) 5-325 MG per tablet Take 1 tablet by mouth every 4 (four) hours as needed for moderate pain. Patient not taking: Reported on 0/02/2724 3/66/44   Leighton Ruff, MD     Vital Signs: BP 91/49 mmHg  Pulse 58  Temp(Src) 98 F (36.7 C) (Oral)  Resp 15  Ht 5\' 10"  (1.778 m)  Wt 141 lb 5 oz (64.1 kg)  BMI 20.28 kg/m2  SpO2 100%  LMP 11/13/2014  Physical Exam  Awake and Alert Appears to be in pain although she states she feels better.  Drain in place and intact  Flushes easily  ~40 mls output, blood tinged fluid, no  purulence seen.  Imaging: US Transvaginal Non-ob  12/20/2014   CLINICAL DATA:  Status post appendectomy and right salpingectomy on 11/28/2014. Post- operative CT has shown a loculated fluid collection in the central and left pelvis.  EXAM: ULTRASOUND PELVIS TRANSVAGINAL  TECHNIQUE: Transvaginal ultrasound examination of the pelvis was performed including evaluation of the uterus, ovaries, adnexal regions, and pelvic cul-de-sac.  COMPARISON:  CT of the abdomen and pelvis on 12/19/2014  FINDINGS: Uterus  Measurements: 9.4 x 3.9 x 3.8 cm. No fibroids or other mass visualized.  Endometrium  Thickness: 10 mm.  No focal abnormality visualized.  Right ovary  Measurements: The right ovary is visualized and measures approximately 3.9 x 2.2 x 2.6 cm. Normal follicles are present. There is a small amount of simple appearing fluid adjacent to the right ovary.  Left ovary  Measurements: The left every measures approximately 3.4 x 1.8 x 2.1 cm. Normal follicles are visualized. There is complex fluid predominantly medial to the left ovary and an elongated area of septated fluid in the adnexal region. The more elongated portion may represent fluid and a dilated fallopian tube. A more complex portion at is more round in shape measures approximately 3.7 cm in greatest diameter.  IMPRESSION: Normal appearance of right and left ovaries. Complex fluid in the left pelvis is predominantly medial to the left ovary. Elongated and septated fluid in the adnexal region may be  a second area of fluid versus a dilated fallopian tube.   Electronically Signed   By: Aletta Edouard M.D.   On: 12/20/2014 15:49   Ct Abdomen Pelvis W Contrast  12/19/2014   CLINICAL DATA:  Status post appendectomy with bilateral flank pain for 1 week  EXAM: CT ABDOMEN AND PELVIS WITH CONTRAST  TECHNIQUE: Multidetector CT imaging of the abdomen and pelvis was performed using the standard protocol following bolus administration of intravenous contrast.  CONTRAST:   137mL OMNIPAQUE IOHEXOL 300 MG/ML  SOLN  COMPARISON:  11/27/2014  FINDINGS: The lung bases are free of acute infiltrate or sizable effusion. The liver, gallbladder, spleen, adrenal glands and pancreas are within normal limits. The kidneys are well visualized bilaterally and demonstrate a normal enhancement pattern. Normal excretion of contrast material is seen.  The appendix and right ovary have been removed. Uterus is deviated to the right and there is evidence of loculated appearing fluid along the lateral aspect of the uterus on the left best seen on image number 76 of series 2. Adjacent to this is a considerable amount of inflammatory change as well as multiloculated fluid collection which may be related to the left ovary although could be adjacent to the left ovary. This measures approximately 3.8 x 5.5 cm. To the right of the uterus best seen on image number 76 of series to there is a second fluid collection identified which may be related to the recent surgery. It measures approximately 3.8 by 1.8 cm. These changes may represent superinfection from the recent surgery and ruptured appendix although may be related to subsequent tubo-ovarian abscess on the left.  There remains significant inflammatory change of a loop of small bowel coursing anterior to this as has been present on multiple previous exams. The bladder is well distended. No bony abnormality is seen.  IMPRESSION: Changes in the pelvis consistent with significant inflammatory change and likely abscess formation on the left. It is uncertain whether this represents a tubo-ovarian abscess or some superinfection postoperatively. Surgical consultation is recommended.  These results were called by telephone at the time of interpretation on 12/19/2014 at 4:08 pm to Zenovia Jarred, MD, who verbally acknowledged these results.   Electronically Signed   By: Inez Catalina M.D.   On: 12/19/2014 16:06   Ct Image Guided Drainage By Percutaneous  Catheter  12/22/2014   INDICATION: Post appendectomy with pelvic and flank pain. CT scan performed 12/19/2014 demonstrates indeterminate fluid collections within the bilateral adnexa, left greater than right. Subsequent pelvic ultrasound confirms the complex fluid collection within the left hemipelvis is separate from the left ovary. Given patient's persistent pelvic pain, request made for attempted placement of a CT-guided percutaneous drainage catheter into the dominant indeterminate fluid collection within the left hemipelvis.  EXAM: CT IMAGE GUIDED DRAINAGE BY PERCUTANEOUS CATHETER  COMPARISON:  CT abdomen pelvis- 12/19/2014; 11/27/2014; pelvic ultrasound- 12/20/2014  MEDICATIONS: The patient is currently admitted to the hospital and receiving intravenous antibiotics. The antibiotics were administered within an appropriate time frame prior to the initiation of the procedure.  ANESTHESIA/SEDATION: Fentanyl 50 mcg IV; Versed 4 mg IV  Total Moderate Sedation time  21 minutes  CONTRAST:  None  COMPLICATIONS: None immediate  PROCEDURE: Informed written consent was obtained from the patient after a discussion of the risks, benefits and alternatives to treatment. The patient was placed prone on the CT gantry and a pre procedural CT was performed re-demonstrating the known common abscess/fluid collection within the left adnexal with dominant cystic component  measuring approximately 5.2 x 4.7 cm (image 14, series 2). The procedure was planned. A timeout was performed prior to the initiation of the procedure.  The left buttocks was prepped and draped in the usual sterile fashion. The overlying soft tissues were anesthetized with 1% lidocaine with epinephrine. Appropriate trajectory was planned with the use of a 22 gauge spinal needle utilizing a left trans gluteal approach. An 18 gauge trocar needle was advanced into the abscess/fluid collection and a short Amplatz super stiff wire was coiled within the collection.  Appropriate positioning was confirmed with a limited CT scan. The tract was serially dilated allowing placement of a 10 [place] all-purpose drainage catheter. Appropriate positioning was confirmed with a limited postprocedural CT scan.  Approximately 10 cc of dark red fluid was aspirated. The tube was connected to a JP bulb and sutured in place. A dressing was placed. The patient tolerated the procedure well without immediate post procedural complication.  IMPRESSION: Successful CT guided placement of a 10 French all purpose drain catheter into the left hemipelvis via left trans gluteal approach with aspiration of 10 mL of dark red fluid. Samples were sent to the laboratory as requested by the ordering clinical team.   Electronically Signed   By: Sandi Mariscal M.D.   On: 12/22/2014 15:28    Labs:  CBC:  Recent Labs  12/19/14 1409 12/20/14 0525 12/21/14 0900 12/22/14 0520  WBC 16.8* 9.7 5.2 5.3  HGB 11.7* 9.6* 9.2* 9.0*  HCT 35.1* 29.4* 28.5* 27.2*  PLT 278 202 190 204    COAGS:  Recent Labs  07/11/14 1300 07/12/14 0540 12/20/14 0525  INR  --  1.90* 1.25  APTT 35  --  42*    BMP:  Recent Labs  12/02/14 0510 12/19/14 1409 12/20/14 0525 12/21/14 0900  NA 137 136 139 139  K 3.6 3.4* 3.8 3.7  CL 105 103 109 110  CO2 26 24 24 24   GLUCOSE 98 95 96 98  BUN <5* 11 7 7   CALCIUM 7.9* 8.9 8.5* 8.3*  CREATININE 0.72 0.82 0.67 0.75  GFRNONAA >60 >60 >60 >60  GFRAA >60 >60 >60 >60    LIVER FUNCTION TESTS:  Recent Labs  07/05/14 1730 07/10/14 1637 11/27/14 0632 12/19/14 1409  BILITOT 1.0 0.8 1.0 0.8  AST 17 18 17 17   ALT 15 19 16 28   ALKPHOS 41 59 41 60  PROT 8.2 7.9 6.8 7.6  ALBUMIN 4.5 3.9 3.9 3.5    Assessment and Plan:  History of recurrent appendicitis, right hydrosalpinx 11/28/14, status post laparoscopy with peritoneal washings, laparotomy, appendectomy, right salpingectomy, prior positive peritoneal fluid cultures revealing Escherichia coli and  pseudomonas.  WBC stable  Perc Drain placed 12/22/2014 by Dr. Pascal Lux  Continue current care,  IV Zosyn, and continue drain to bulb.   Signed: Murrell Redden PA-C 12/23/2014, 8:40 AM   I spent a total of 15 Minutes in face to face in clinical consultation/evaluation, greater than 50% of which was counseling/coordinating care for perc drain.

## 2014-12-24 ENCOUNTER — Encounter (HOSPITAL_COMMUNITY): Payer: Self-pay

## 2014-12-24 LAB — CULTURE, BLOOD (ROUTINE X 2)
CULTURE: NO GROWTH
CULTURE: NO GROWTH

## 2014-12-24 NOTE — Progress Notes (Signed)
Patient ID: Katrina Deleon, female   DOB: [birth year], 38 y.o.   MRN: [id number]      Referring Physician(s): Mamie Laurel  Chief Complaint:  F/U for percutaneous drain placement  Subjective:  [name] states she is feeling much better today. She is up walking around in the room and appears to be feeling better.  She only c/o a little soreness at the drain site when she sits, so she is trying to sit as much today.  She is not complaining of any numbness today  She is only concerned about the other fluid collection on the right.  Allergies: Other  Medications: Prior to Admission medications   Medication Sig Start Date End Date Taking? Authorizing Provider  Fish Oil OIL Take 5 mLs by mouth daily.   Yes Historical Provider, MD  ibuprofen (ADVIL,MOTRIN) 200 MG tablet Take 400 mg by mouth every 4 (four) hours as needed for headache or moderate pain.    Yes Historical Provider, MD  Multiple Minerals-Vitamins (CALCIUM & VIT D3 BONE HEALTH) LIQD Take 5 mLs by mouth daily.   Yes Historical Provider, MD  amoxicillin-clavulanate (AUGMENTIN) 875-125 MG per tablet Take 1 tablet by mouth 2 (two) times daily. Patient not taking: Reported on 5/42/7062 3/76/28   Leighton Ruff, MD  Beclomethasone Dipropionate 80 MCG/ACT AERS Place 2 sprays into the nose daily. Patient not taking: Reported on 11/27/2014 09/27/12   Marcial Pacas, DO  HYDROcodone-acetaminophen (NORCO/VICODIN) 5-325 MG per tablet Take 1 tablet by mouth every 4 (four) hours as needed for moderate pain. Patient not taking: Reported on 08/08/1759 10/31/35   Leighton Ruff, MD     Vital Signs: BP 101/58 mmHg  Pulse 65  Temp(Src) 98.6 F (37 C) (Oral)  Resp 18  Ht 5\' 10"  (1.778 m)  Wt 141 lb 5 oz (64.1 kg)  BMI 20.28 kg/m2  SpO2 100%  LMP 11/13/2014  Physical Exam   Awake and Alert Appears to be in pain although she states she feels better.  Drain in place and intact, drain site looks good, no erythema.  ~50 mls output, blood  tinged fluid, no purulence seen.   Imaging: US Transvaginal Non-ob  12/20/2014   CLINICAL DATA:  Status post appendectomy and right salpingectomy on 11/28/2014. Post- operative CT has shown a loculated fluid collection in the central and left pelvis.  EXAM: ULTRASOUND PELVIS TRANSVAGINAL  TECHNIQUE: Transvaginal ultrasound examination of the pelvis was performed including evaluation of the uterus, ovaries, adnexal regions, and pelvic cul-de-sac.  COMPARISON:  CT of the abdomen and pelvis on 12/19/2014  FINDINGS: Uterus  Measurements: 9.4 x 3.9 x 3.8 cm. No fibroids or other mass visualized.  Endometrium  Thickness: 10 mm.  No focal abnormality visualized.  Right ovary  Measurements: The right ovary is visualized and measures approximately 3.9 x 2.2 x 2.6 cm. Normal follicles are present. There is a small amount of simple appearing fluid adjacent to the right ovary.  Left ovary  Measurements: The left every measures approximately 3.4 x 1.8 x 2.1 cm. Normal follicles are visualized. There is complex fluid predominantly medial to the left ovary and an elongated area of septated fluid in the adnexal region. The more elongated portion may represent fluid and a dilated fallopian tube. A more complex portion at is more round in shape measures approximately 3.7 cm in greatest diameter.  IMPRESSION: Normal appearance of right and left ovaries. Complex fluid in the left pelvis is predominantly medial to the left ovary. Elongated and septated fluid  in the adnexal region may be a second area of fluid versus a dilated fallopian tube.   Electronically Signed   By: Aletta Edouard M.D.   On: 12/20/2014 15:49   Ct Image Guided Drainage By Percutaneous Catheter  12/22/2014   INDICATION: Post appendectomy with pelvic and flank pain. CT scan performed 12/19/2014 demonstrates indeterminate fluid collections within the bilateral adnexa, left greater than right. Subsequent pelvic ultrasound confirms the complex fluid collection  within the left hemipelvis is separate from the left ovary. Given patient's persistent pelvic pain, request made for attempted placement of a CT-guided percutaneous drainage catheter into the dominant indeterminate fluid collection within the left hemipelvis.  EXAM: CT IMAGE GUIDED DRAINAGE BY PERCUTANEOUS CATHETER  COMPARISON:  CT abdomen pelvis- 12/19/2014; 11/27/2014; pelvic ultrasound- 12/20/2014  MEDICATIONS: The patient is currently admitted to the hospital and receiving intravenous antibiotics. The antibiotics were administered within an appropriate time frame prior to the initiation of the procedure.  ANESTHESIA/SEDATION: Fentanyl 50 mcg IV; Versed 4 mg IV  Total Moderate Sedation time  21 minutes  CONTRAST:  None  COMPLICATIONS: None immediate  PROCEDURE: Informed written consent was obtained from the patient after a discussion of the risks, benefits and alternatives to treatment. The patient was placed prone on the CT gantry and a pre procedural CT was performed re-demonstrating the known common abscess/fluid collection within the left adnexal with dominant cystic component measuring approximately 5.2 x 4.7 cm (image 14, series 2). The procedure was planned. A timeout was performed prior to the initiation of the procedure.  The left buttocks was prepped and draped in the usual sterile fashion. The overlying soft tissues were anesthetized with 1% lidocaine with epinephrine. Appropriate trajectory was planned with the use of a 22 gauge spinal needle utilizing a left trans gluteal approach. An 18 gauge trocar needle was advanced into the abscess/fluid collection and a short Amplatz super stiff wire was coiled within the collection. Appropriate positioning was confirmed with a limited CT scan. The tract was serially dilated allowing placement of a 10 [place] all-purpose drainage catheter. Appropriate positioning was confirmed with a limited postprocedural CT scan.  Approximately 10 cc of dark red fluid was  aspirated. The tube was connected to a JP bulb and sutured in place. A dressing was placed. The patient tolerated the procedure well without immediate post procedural complication.  IMPRESSION: Successful CT guided placement of a 10 French all purpose drain catheter into the left hemipelvis via left trans gluteal approach with aspiration of 10 mL of dark red fluid. Samples were sent to the laboratory as requested by the ordering clinical team.   Electronically Signed   By: Sandi Mariscal M.D.   On: 12/22/2014 15:28    Labs:  CBC:  Recent Labs  12/19/14 1409 12/20/14 0525 12/21/14 0900 12/22/14 0520  WBC 16.8* 9.7 5.2 5.3  HGB 11.7* 9.6* 9.2* 9.0*  HCT 35.1* 29.4* 28.5* 27.2*  PLT 278 202 190 204    COAGS:  Recent Labs  07/11/14 1300 07/12/14 0540 12/20/14 0525  INR  --  1.90* 1.25  APTT 35  --  42*    BMP:  Recent Labs  12/02/14 0510 12/19/14 1409 12/20/14 0525 12/21/14 0900  NA 137 136 139 139  K 3.6 3.4* 3.8 3.7  CL 105 103 109 110  CO2 26 24 24 24   GLUCOSE 98 95 96 98  BUN <5* 11 7 7   CALCIUM 7.9* 8.9 8.5* 8.3*  CREATININE 0.72 0.82 0.67 0.75  GFRNONAA >  60 >60 >60 >60  GFRAA >60 >60 >60 >60    LIVER FUNCTION TESTS:  Recent Labs  07/05/14 1730 07/10/14 1637 11/27/14 0632 12/19/14 1409  BILITOT 1.0 0.8 1.0 0.8  AST 17 18 17 17   ALT 15 19 16 28   ALKPHOS 41 59 41 60  PROT 8.2 7.9 6.8 7.6  ALBUMIN 4.5 3.9 3.9 3.5    Assessment and Plan:  History of recurrent appendicitis, 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  Perc Drain placed 12/22/2014 by Dr. Pascal Lux  Continue current care, IV Zosyn, and continue drain to bulb.  Discussed patient with Dr. Hassell Done today.  He feels patient may need another CT scan to not only evaluate left collection for improvement/resolution, but to also evaluate  the right collection  Will discuss need for CT scan and possible drainage with IR MD tomorrow.  Signed: Murrell Redden PA-C 12/24/2014, 11:44 AM   I spent a total of 15 Minutes in face to face in clinical consultation/evaluation, greater than 50% of which was counseling/coordinating care for f/u for perc drain placement.

## 2014-12-24 NOTE — Progress Notes (Signed)
Patient ID: Katrina Deleon, female   DOB: Nov 29, 1975, 39 y.o.   MRN: [id number] [facility] Surgery Progress Note:   * No surgery found *  Subjective: Mental status is clear.  Discussed rationale for heparin.  She and her husband want [place] to stop the heparin shots.  This was ordered.   Objective: Vital signs in last 24 hours: Temp:  [98.6 F (37 C)-99.5 F (37.5 C)] 98.6 F (37 C) (07/31 1035) Pulse Rate:  [63-73] 65 (07/31 1035) Resp:  [16-18] 18 (07/31 1035) BP: (98-104)/(48-61) 101/58 mmHg (07/31 1035) SpO2:  [99 %-100 %] 100 % (07/31 1035)  Intake/Output from previous day: 07/30 0701 - 07/31 0700 In: -  Out: 195 [Emesis/NG output:120; Drains:50; Blood:25] Intake/Output this shift:    Physical Exam: Work of breathing is normal.  JP with serosanguinous fluid.  Incision is healing in the midline with proud flesh.  In the absence of silver nitrate, will apply dry dressing daily  Lab Results:  Results for orders placed or performed during the hospital encounter of 12/19/14 (from the past 48 hour(s))  Culture, routine-abscess     Status: None (Preliminary result)   Collection Time: 12/22/14  1:40 PM  Result Value Ref Range   Specimen Description DRAINAGE    Special Requests Normal    Gram Stain      NO WBC SEEN NO SQUAMOUS EPITHELIAL CELLS SEEN NO ORGANISMS SEEN Performed at [facility]    Culture      NO GROWTH 1 DAY Performed at [facility]    Report Status PENDING     Radiology/Results: Ct Image Guided Drainage By Percutaneous Catheter  12/22/2014   INDICATION: Post appendectomy with pelvic and flank pain. CT scan performed 12/19/2014 demonstrates indeterminate fluid collections within the bilateral adnexa, left greater than right. Subsequent pelvic ultrasound confirms the complex fluid collection within the left hemipelvis is separate from the left ovary. Given patient's persistent pelvic pain, request made for attempted placement of a  CT-guided percutaneous drainage catheter into the dominant indeterminate fluid collection within the left hemipelvis.  EXAM: CT IMAGE GUIDED DRAINAGE BY PERCUTANEOUS CATHETER  COMPARISON:  CT abdomen pelvis- 12/19/2014; 11/27/2014; pelvic ultrasound- 12/20/2014  MEDICATIONS: The patient is currently admitted to the hospital and receiving intravenous antibiotics. The antibiotics were administered within an appropriate time frame prior to the initiation of the procedure.  ANESTHESIA/SEDATION: Fentanyl 50 mcg IV; Versed 4 mg IV  Total Moderate Sedation time  21 minutes  CONTRAST:  None  COMPLICATIONS: None immediate  PROCEDURE: Informed written consent was obtained from the patient after a discussion of the risks, benefits and alternatives to treatment. The patient was placed prone on the CT gantry and a pre procedural CT was performed re-demonstrating the known common abscess/fluid collection within the left adnexal with dominant cystic component measuring approximately 5.2 x 4.7 cm (image 14, series 2). The procedure was planned. A timeout was performed prior to the initiation of the procedure.  The left buttocks was prepped and draped in the usual sterile fashion. The overlying soft tissues were anesthetized with 1% lidocaine with epinephrine. Appropriate trajectory was planned with the use of a 22 gauge spinal needle utilizing a left trans gluteal approach. An 18 gauge trocar needle was advanced into the abscess/fluid collection and a short Amplatz super stiff wire was coiled within the collection. Appropriate positioning was confirmed with a limited CT scan. The tract was serially dilated allowing placement of a 10 [place] all-purpose drainage catheter. Appropriate positioning  was confirmed with a limited postprocedural CT scan.  Approximately 10 cc of dark red fluid was aspirated. The tube was connected to a JP bulb and sutured in place. A dressing was placed. The patient tolerated the procedure well without  immediate post procedural complication.  IMPRESSION: Successful CT guided placement of a 10 French all purpose drain catheter into the left hemipelvis via left trans gluteal approach with aspiration of 10 mL of dark red fluid. Samples were sent to the laboratory as requested by the ordering clinical team.   Electronically Signed   By: Sandi Mariscal M.D.   On: 12/22/2014 15:28    Anti-infectives: Anti-infectives    Start     Dose/Rate Route Frequency Ordered Stop   12/20/14 0200  piperacillin-tazobactam (ZOSYN) IVPB 3.375 g     3.375 g 12.5 mL/hr over 240 Minutes Intravenous Every 8 hours 12/19/14 1745     12/19/14 1745  piperacillin-tazobactam (ZOSYN) IVPB 3.375 g     3.375 g 100 mL/hr over 30 Minutes Intravenous STAT 12/19/14 1744 12/19/14 1837      Assessment/Plan: Problem List: Patient Active Problem List   Diagnosis Date Noted  . Intra-abdominal abscess 12/19/2014  . Hydrosalpinx s/p right RSO 11/28/2014 12/03/2014  . Abdominal pain 11/27/2014  . Ruptured appendicitis s/p lap appy 11/28/2014 07/14/2014  . Pyrexia 07/10/2014  . Right lower quadrant abdominal abscess 07/10/2014  . Infertility, female 06/29/2012  . Family history of breast cancer in mother 06/29/2012    Await repeat scan to see if other pelvic fluid collection needs to be tapped.   * No surgery found *    LOS: 5 days   Matt B. Hassell Done, MD, [facility] [phone] beeper [phone]  12/24/2014 10:37 AM

## 2014-12-25 DIAGNOSIS — N739 Female pelvic inflammatory disease, unspecified: Secondary | ICD-10-CM | POA: Insufficient documentation

## 2014-12-25 MED ORDER — OXYCODONE HCL 5 MG PO TABS: 5.0000 mg | ORAL_TABLET | ORAL | Status: DC | PRN

## 2014-12-25 MED ORDER — SACCHAROMYCES BOULARDII 250 MG PO CAPS: ORAL_CAPSULE | ORAL | Status: DC

## 2014-12-25 MED ORDER — CIPROFLOXACIN HCL 500 MG PO TABS: 500.0000 mg | ORAL_TABLET | Freq: Two times a day (BID) | ORAL | Status: DC

## 2014-12-25 MED ORDER — METRONIDAZOLE 500 MG PO TABS
500.0000 mg | ORAL_TABLET | Freq: Three times a day (TID) | ORAL | Status: DC
  Administered 2014-12-25: 500 mg via ORAL
  Filled 2014-12-25 (×3): qty 1

## 2014-12-25 MED ORDER — CIPROFLOXACIN HCL 500 MG PO TABS
500.0000 mg | ORAL_TABLET | Freq: Two times a day (BID) | ORAL | Status: DC
  Filled 2014-12-25 (×2): qty 1

## 2014-12-25 MED ORDER — METRONIDAZOLE 500 MG PO TABS: 500.0000 mg | ORAL_TABLET | Freq: Three times a day (TID) | ORAL | Status: DC

## 2014-12-25 MED ORDER — ACETAMINOPHEN 325 MG PO TABS: 650.0000 mg | ORAL_TABLET | Freq: Four times a day (QID) | ORAL | Status: DC | PRN

## 2014-12-25 NOTE — Discharge Instructions (Signed)
Percutaneous Abscess Drain, Care After  Refer to this sheet in the next few weeks. These instructions provide you with information on caring for yourself after your procedure. Your health care provider may also give you more specific instructions. Your treatment has been planned according to current medical practices, but problems sometimes occur. Call your health care provider if you have any problems or questions after your procedure. WHAT TO EXPECT AFTER THE PROCEDURE After your procedure, it is typical to have the following:   A small amount of discomfort in the area where the drainage tube was placed.  A small amount of bruising around the area where the drainage tube was placed.  Sleepiness and fatigue for the rest of the day from the medicines used. HOME CARE INSTRUCTIONS  Rest at home for 1-2 days following your procedure or as directed by your health care provider.  If you go home right after the procedure, plan to have someone with you for 24 hours.  Do not take a bathor shower for 24 hours after your procedure.  Take medicines only as directed by your health care provider. Ask your health care provider when you can resume taking any normal medicines.  Change bandages (dressings) as directed.   You may be told to record the amount of drainage from the bag every time you empty it. Follow your health care provider's directions for emptying the bag. Write down the amount of drainage, the date, and the time you emptied it.  Call your health care provider when the drain is putting out less than 10 mL of drainage per day for 2-3 days in a row or as directed by your health care provider.  Follow your health care provider's instructions for cleaning the drainage tube. You may need to clean the tube every day so that it does not clog. SEEK MEDICAL CARE IF:  You have increased bleeding (more than a small spot) from the site where the drainage tube was placed.  You have redness,  swelling, or increasing pain around the site where the drainage tube was placed.  You notice a discharge or bad smell coming from the site where the drainage tube was placed.  You have a fever or chills.  You have pain that is not helped by medicine.  SEEK IMMEDIATE MEDICAL CARE IF:  There is leakage around the drainage tube.  The drainage tube pulls out.  You suddenly stop having drainage from the tube.  You suddenly have blood in the drainage fluid.  You become dizzy or faint.  You develop a rash.   You have nausea or vomiting.  You have difficulty breathing, feel short of breath, or feel faint.   You develop chest pain.  You have problems with your speech or vision.  You have trouble balancing or moving your arms or legs. Document Released: 09/26/2013 Document Reviewed: 07/01/2013 [facility] Patient Information 2015 [place]. This information is not intended to replace advice given to you by your health care provider. Make sure you discuss any questions you have with your health care provider.  [facility] ______[facility] LAPAROSCOPIC SURGERY: POST OP INSTRUCTIONS Always review your discharge instruction sheet given to you by the facility where your surgery was performed. IF YOU HAVE DISABILITY OR FAMILY LEAVE FORMS, YOU MUST BRING THEM TO THE OFFICE FOR PROCESSING.   DO NOT GIVE THEM TO YOUR DOCTOR.  1. A prescription for pain medication may be given to you upon discharge.  Take your pain medication  as prescribed, if needed.  If narcotic pain medicine is not needed, then you may take acetaminophen (Tylenol) or ibuprofen (Advil) as needed. 2. Take your usually prescribed medications unless otherwise directed. 3. If you need a refill on your pain medication, please contact your pharmacy.  They will contact our office to request authorization. Prescriptions will not be filled after 5pm or on week-ends. 4. You should follow a light diet the first few  days after arrival home, such as soup and crackers, etc.  Be sure to include lots of fluids daily. 5. Most patients will experience some swelling and bruising in the area of the incisions.  Ice packs will help.  Swelling and bruising can take several days to resolve.  6. It is common to experience some constipation if taking pain medication after surgery.  Increasing fluid intake and taking a stool softener (such as Colace) will usually help or prevent this problem from occurring.  A mild laxative (Milk of Magnesia or Miralax) should be taken according to package instructions if there are no bowel movements after 48 hours. 7. Unless discharge instructions indicate otherwise, you may remove your bandages 24-48 hours after surgery, and you may shower at that time.  You may have steri-strips (small skin tapes) in place directly over the incision.  These strips should be left on the skin for 7-10 days.  If your surgeon used skin glue on the incision, you may shower in 24 hours.  The glue will flake off over the next 2-3 weeks.  Any sutures or staples will be removed at the office during your follow-up visit. 8. ACTIVITIES:  You may resume regular (light) daily activities beginning the next day--such as daily self-care, walking, climbing stairs--gradually increasing activities as tolerated.  You may have sexual intercourse when it is comfortable.  Refrain from any heavy lifting or straining until approved by your doctor. a. You may drive when you are no longer taking prescription pain medication, you can comfortably wear a seatbelt, and you can safely maneuver your car and apply brakes. b. RETURN TO WORK:  __________________________________________________________ 9. You should see your doctor in the office for a follow-up appointment approximately 2-3 weeks after your surgery.  Make sure that you call for this appointment within a day or two after you arrive home to insure a convenient appointment  time. 10. OTHER INSTRUCTIONS: __________________________________________________________________________________________________________________________ __________________________________________________________________________________________________________________________ WHEN TO CALL YOUR DOCTOR: 1. Fever over 101.0 2. Inability to urinate 3. Continued bleeding from incision. 4. Increased pain, redness, or drainage from the incision. 5. Increasing abdominal pain  The clinic staff is available to answer your questions during regular business hours.  Please dont hesitate to call and ask to speak to one of the nurses for clinical concerns.  If you have a medical emergency, go to the nearest emergency room or call 911.  A surgeon from [facility] is always on call at the hospital. [address], [place], [place]  [address] ? [address], [place], [place]   [address] [phone] ? [phone] ? FAX [phone] Web site: [email or web address]

## 2014-12-25 NOTE — Plan of Care (Signed)
Problem: Discharge Progression Outcomes Goal: Complications resolved/controlled Outcome: Adequate for Discharge Pt will be going home with JP drain

## 2014-12-25 NOTE — Discharge Summary (Signed)
Physician Discharge Summary  Patient ID: Katrina Deleon MRN: [id number] DOB/AGE: June 23, 1975 39 y.o.  Admit date: 12/19/2014 Discharge date: 12/25/2014 PCP: Iran Planas, PA-C  Consults:  Dr. Emeterio Reeve OB-Gyn, Drs. Watts/Yamagata from IR Admission Diagnoses:  1. Complex right and left intraabdominal abscesses, involving or adjacent to left ovary 2. Hx of Recurrent appendicitis, right hydrosalpinx 11/28/14 with Diagnostic laparoscopy, peritoneal washings for culture, laparotomy, appendectomy, right salpingectomy; Dr. Fanny Skates and Dr. Janie Morning   Discharge Diagnoses:  Same  Active Problems:   Intra-abdominal abscess   Pelvic abscess in female   PROCEDURES: IR DRAIN PLACEMENT 7  Hospital Course:  39 y/o who presented 07/06/14 with diffuse abdominal discomfort from [facility]. CT scan showed an inflammatory changes, but could not really tell if the was a tubo-ovarian infection or appendix, appendix was not visualized original CT scan. They offered admission but she went home. She returned on 2/15 with RLQ pain and WBC 18.5K. She was admitted and taken to the OR on 07/13/14, by Dr. Lucia Gaskins with: Laparoscopic exploration with evacuation of right pelvic abscess and irrigation of the abdominal cavity. He could not find the appendix. She did well and ultimately discharged on 07/18/14. She did well till she returned on 11/27/14 with recurrent pain, ascites around her liver and spleen. Some stranding around what was thought to be her appendix. She was taken back tot he OR on 11/27/14, with a post op diagnosis of Recurrent appendicitis and right hydrosalpinx. Surgery at that time included: Diagnostic laparoscopy, peritoneal washings for culture, laparotomy, appendectomy, right salpingectomy, by Dr. Dalbert Batman and Janie Morning. Findings include: a purulent vaginal discharge noted when the Foley catheter was inserted. The urine was clear. There was a right lower quadrant inflammatory process.  There were a few loops of small bowel with inflammatory exudate present. The appendix was densely adherent to the lateral aspect of the right tube and ovary and it looked chronically and acutely inflamed. The right ovary looks normal. The right tube was soft but was quite swollen. There was no purulence coming from the fallopian tube. The left tube and ovary looked fairly normal although it was tethered in the pelvis. Dr. Skeet Latch recommended that the right fallopian tube be removed because of its appearance and the history and the risk of future ectopic.[name] There was some bloody brownish fluid in the right subphrenic and left subphrenic areas. This really did not have much odor. Post op she did well and was discharged on 12/03/14. She did well till 7/23 when she started having some pain lower abdomen. She says she can feel stool moving thru colon. She developed some pain and low grade fever Sunday 7/24, but yesterday pain increased and she had one temp up to 101. She called the office and was referred to the ED. Currently she is still having some pain, mostly left lower abdomen. No nausea, no vomiting , she has been eating without issue. Currently labs show WBC 16.8 with left shift, K+ low, otherwise normal CMP. CT/UA are pending. UA negative. CT scan: The appendix and right ovary have been removed. Uterus is deviated to the right and there is evidence of loculated appearing fluid along the lateral aspect of the uterus on the left best. Adjacent to this is a considerable amount of inflammatory change as well as multiloculated fluid collection which may be related to the left ovary although could be adjacent to the left ovary. This measures approximately 3.8 x 5.5cm. To the right of the uterus best seen  on image number 76 of series to there is a second fluid collection identified which may be related to the recent surgery. It measures approximately 3.8 by 1.8 cm. These changes may represent superinfection  from the recent surgery and ruptured appendix although may be related to subsequent tubo-ovarian abscess on the left. I have reviewed this with Dr. Barry Dienes and have contacted OB-Gyn, Dr. Claiborne Billings Leggett's office for a consult. We plan to admit her and start antibiotics.  She was admitted by our service and started on IV Zosyn. IR was contacted and Ultrasound was obtained the following day.  WBC showed good decline after antibiotics were initiated.  It was initially Dr. Margaretmary Dys opinion that she was not a good candidate for IR drain.  We kept her on antitbiotics.  She was reexamined and Dr. Pascal Lux was able to place a drain into the left fluid collection on 12/22/14.    10 ml of dark fluid drained. As of this time there has been no growth from the culture or blood cultures obtained at admission.  She has been afebrile, with temp no more than 99 over the last 48 hours.  VSS, she is taking a regular diet and has had very little discomfort.  No pain medication for the last 48 hours.  IR does not want to repeat the CT scan till later this week.  She is still draining some fluid from the current abscess.  She is worried about the right sided fluid collection and I reported IR said it was not amenable to draining.  Pt and husband ask about going home on antibiotics, she was seen by Dr. Hassell Done and he agreed to allow her to go home on antibiotics.  She is going on Cipro and Flagyl.  We gave her a dose while she was here in the hospital.  She tolerated it well.  We have follow up with Dr. Dalbert Batman arranged.  We are working on follow up CT scan as OP later this week.  They will call her with appointment and instructions for CT.  She has had about 6 days of IV Zosyn, (18 doses) and started on Cipro/Flagyl today.  She has 10 days with a refill x 1.    Condition on d/c:  Improving    Disposition: 01-Home or Self Care     Medication List    STOP taking these medications        amoxicillin-clavulanate 875-125 MG per  tablet  Commonly known as:  AUGMENTIN     HYDROcodone-acetaminophen 5-325 MG per tablet  Commonly known as:  NORCO/VICODIN      TAKE these medications        acetaminophen 325 MG tablet  Commonly known as:  TYLENOL  Take 2 tablets (650 mg total) by mouth every 6 (six) hours as needed for mild pain (or Temp > 100).     Beclomethasone Dipropionate 80 MCG/ACT Aers  Place 2 sprays into the nose daily.     CALCIUM & VIT D3 BONE HEALTH Liqd  Take 5 mLs by mouth daily.     ciprofloxacin 500 MG tablet  Commonly known as:  CIPRO  Take 1 tablet (500 mg total) by mouth 2 (two) times daily.     Fish Oil Oil  Take 5 mLs by mouth daily.     ibuprofen 200 MG tablet  Commonly known as:  ADVIL,MOTRIN  Take 400 mg by mouth every 4 (four) hours as needed for headache or moderate pain.  metroNIDAZOLE 500 MG tablet  Commonly known as:  FLAGYL  Take 1 tablet (500 mg total) by mouth every 8 (eight) hours.     oxyCODONE 5 MG immediate release tablet  Commonly known as:  Oxy IR/ROXICODONE  Take 1-2 tablets (5-10 mg total) by mouth every 4 (four) hours as needed for moderate pain.     saccharomyces boulardii 250 MG capsule  Commonly known as:  FLORASTOR  You can buy this over the counter.  Continue it till after you have completed your entire antibiotic course.           Follow-up Information    Follow up with Adin Hector, MD On 01/01/2015.   Specialty:  General Surgery   Why:  Your appointment is at 10:45 AM, be at the clinic 30 minutes early for check in.   Contact information:   [address] [place] [address] [phone]       Follow up with Interventional Radiology Scheduler.   Why:  They are working on Optometrist for your insurance and to have repeat study later this week.  They will call you.  If you don't hear by 12/27/14,call and see what the plan is.   Contact information:   [phone]      Signed: Earnstine Regal 12/25/2014, 5:42  PM

## 2014-12-25 NOTE — Progress Notes (Signed)
Patient ID: Katrina Deleon, female   DOB: [birth year], 39 y.o.   MRN: [id number]    Referring Physician(s): CCS  Chief Complaint:  Pelvic fluid collection  Subjective:  Patient feeling much better today; denies significant abdominal or back pain, nausea/ vomiting, right lower extremity pain/numbness; has ambulated in hallway without difficulty.  Allergies: Other  Medications: Prior to Admission medications   Medication Sig Start Date End Date Taking? Authorizing Provider  Fish Oil OIL Take 5 mLs by mouth daily.   Yes Historical Provider, MD  ibuprofen (ADVIL,MOTRIN) 200 MG tablet Take 400 mg by mouth every 4 (four) hours as needed for headache or moderate pain.    Yes Historical Provider, MD  Multiple Minerals-Vitamins (CALCIUM & VIT D3 BONE HEALTH) LIQD Take 5 mLs by mouth daily.   Yes Historical Provider, MD  amoxicillin-clavulanate (AUGMENTIN) 875-125 MG per tablet Take 1 tablet by mouth 2 (two) times daily. Patient not taking: Reported on 2/95/1884 1/66/06   Leighton Ruff, MD  Beclomethasone Dipropionate 80 MCG/ACT AERS Place 2 sprays into the nose daily. Patient not taking: Reported on 11/27/2014 09/27/12   Marcial Pacas, DO  HYDROcodone-acetaminophen (NORCO/VICODIN) 5-325 MG per tablet Take 1 tablet by mouth every 4 (four) hours as needed for moderate pain. Patient not taking: Reported on 07/24/6008 9/32/35   Leighton Ruff, MD     Vital Signs: BP 95/54 mmHg  Pulse 61  Temp(Src) 97.4 F (36.3 C) (Oral)  Resp 16  Ht 5\' 10"  (1.778 m)  Wt 141 lb 5 oz (64.1 kg)  BMI 20.28 kg/m2  SpO2 100%  LMP 11/13/2014  Physical Exam patient awake, alert.; Left transgluteal drain intact, insertion site okay, mildly tender, output today approximate 15 mL blood tinged fluid; cultures negative to date  Imaging: Ct Image Guided Drainage By Percutaneous Catheter  12/22/2014   INDICATION: Post appendectomy with pelvic and flank pain. CT scan performed 12/19/2014 demonstrates indeterminate  fluid collections within the bilateral adnexa, left greater than right. Subsequent pelvic ultrasound confirms the complex fluid collection within the left hemipelvis is separate from the left ovary. Given patient's persistent pelvic pain, request made for attempted placement of a CT-guided percutaneous drainage catheter into the dominant indeterminate fluid collection within the left hemipelvis.  EXAM: CT IMAGE GUIDED DRAINAGE BY PERCUTANEOUS CATHETER  COMPARISON:  CT abdomen pelvis- 12/19/2014; 11/27/2014; pelvic ultrasound- 12/20/2014  MEDICATIONS: The patient is currently admitted to the hospital and receiving intravenous antibiotics. The antibiotics were administered within an appropriate time frame prior to the initiation of the procedure.  ANESTHESIA/SEDATION: Fentanyl 50 mcg IV; Versed 4 mg IV  Total Moderate Sedation time  21 minutes  CONTRAST:  None  COMPLICATIONS: None immediate  PROCEDURE: Informed written consent was obtained from the patient after a discussion of the risks, benefits and alternatives to treatment. The patient was placed prone on the CT gantry and a pre procedural CT was performed re-demonstrating the known common abscess/fluid collection within the left adnexal with dominant cystic component measuring approximately 5.2 x 4.7 cm (image 14, series 2). The procedure was planned. A timeout was performed prior to the initiation of the procedure.  The left buttocks was prepped and draped in the usual sterile fashion. The overlying soft tissues were anesthetized with 1% lidocaine with epinephrine. Appropriate trajectory was planned with the use of a 22 gauge spinal needle utilizing a left trans gluteal approach. An 18 gauge trocar needle was advanced into the abscess/fluid collection and a short Amplatz super stiff wire was coiled within the  collection. Appropriate positioning was confirmed with a limited CT scan. The tract was serially dilated allowing placement of a 10 [place] all-purpose  drainage catheter. Appropriate positioning was confirmed with a limited postprocedural CT scan.  Approximately 10 cc of dark red fluid was aspirated. The tube was connected to a JP bulb and sutured in place. A dressing was placed. The patient tolerated the procedure well without immediate post procedural complication.  IMPRESSION: Successful CT guided placement of a 10 French all purpose drain catheter into the left hemipelvis via left trans gluteal approach with aspiration of 10 mL of dark red fluid. Samples were sent to the laboratory as requested by the ordering clinical team.   Electronically Signed   By: Sandi Mariscal M.D.   On: 12/22/2014 15:28    Labs:  CBC:  Recent Labs  12/19/14 1409 12/20/14 0525 12/21/14 0900 12/22/14 0520  WBC 16.8* 9.7 5.2 5.3  HGB 11.7* 9.6* 9.2* 9.0*  HCT 35.1* 29.4* 28.5* 27.2*  PLT 278 202 190 204    COAGS:  Recent Labs  07/11/14 1300 07/12/14 0540 12/20/14 0525  INR  --  1.90* 1.25  APTT 35  --  42*    BMP:  Recent Labs  12/02/14 0510 12/19/14 1409 12/20/14 0525 12/21/14 0900  NA 137 136 139 139  K 3.6 3.4* 3.8 3.7  CL 105 103 109 110  CO2 26 24 24 24   GLUCOSE 98 95 96 98  BUN <5* 11 7 7   CALCIUM 7.9* 8.9 8.5* 8.3*  CREATININE 0.72 0.82 0.67 0.75  GFRNONAA >60 >60 >60 >60  GFRAA >60 >60 >60 >60    LIVER FUNCTION TESTS:  Recent Labs  07/05/14 1730 07/10/14 1637 11/27/14 0632 12/19/14 1409  BILITOT 1.0 0.8 1.0 0.8  AST 17 18 17 17   ALT 15 19 16 28   ALKPHOS 41 59 41 60  PROT 8.2 7.9 6.8 7.6  ALBUMIN 4.5 3.9 3.9 3.5    Assessment and Plan: Status post drainage of left pelvic fluid collection on 7/29; patient currently afebrile and stable; recent imaging studies were reviewed by Dr. Annamaria Boots and plan at this time is for patient to obtain follow-up CT later this week as outpatient. Recommend flushing of drain daily with 5 mL sterile saline, recording of output and change dressing every 2-3 days as needed. Our service will  contact patient with f/u CT appointment date/time.  Signed: D. Rowe Robert 12/25/2014, 2:24 PM   I spent a total of 15 minutes in face to face in clinical consultation/evaluation, greater than 50% of which was counseling/coordinating care for left pelvic fluid collection drainage

## 2014-12-25 NOTE — Progress Notes (Signed)
  Subjective: She feels better, buttocks drain site is sore, but abdomen feels better.  She says she has not pain in abdomen now.  They wanted to get the repeat CT scan and are very concerned about the fluid on the right.  Anxious to move forward.    Objective: Vital signs in last 24 hours: Temp:  [97.4 F (36.3 C)-98.7 F (37.1 C)] 97.4 F (36.3 C) (08/01 0821) Pulse Rate:  [59-65] 61 (08/01 0821) Resp:  [15-18] 16 (08/01 0821) BP: (90-103)/(44-60) 95/54 mmHg (08/01 0821) SpO2:  [98 %-100 %] 100 % (08/01 0821) Last BM Date: 12/24/14 (per pt) 45 ml from the Drain Afebrile, VSS No labs Intake/Output from previous day: 07/31 0701 - 08/01 0700 In: -  Out: 45 [Drains:45] Intake/Output this shift:    General appearance: alert, cooperative and no distress GI: denies pain and tolerating PO's well.  Lab Results:  No results for input(s): WBC, HGB, HCT, PLT in the last 72 hours.  BMET No results for input(s): NA, K, CL, CO2, GLUCOSE, BUN, CREATININE, CALCIUM in the last 72 hours. PT/INR No results for input(s): LABPROT, INR in the last 72 hours.   Recent Labs Lab 12/19/14 1409  AST 17  ALT 28  ALKPHOS 60  BILITOT 0.8  PROT 7.6  ALBUMIN 3.5     Lipase     Component Value Date/Time   LIPASE 25 12/19/2014 1409     Studies/Results: No results found.  Medications: . piperacillin-tazobactam (ZOSYN)  IV  3.375 g Intravenous Q8H  . saccharomyces boulardii  250 mg Oral BID    Assessment/Plan 1. Complex right and left intraabdominal abscesses, involving or adjacent to left ovary 2. Hx of Recurrent appendicitis, right hydrosalpinx 11/28/14 with Diagnostic laparoscopy, peritoneal washings for culture, laparotomy, appendectomy, right salpingectomy; Dr. Fanny Skates and Dr. Janie Morning 2A.  CT guided drain placement 12/22/14 - No growth from culture obtained at that time. 3. Malnutrition with prealbumin 9.7 4. Borderline coagulation studies/Normal  LFT's 5. Antibiotics: Day 6.5 of Zosyn 6. DVT: Heparin d/ced at husband's request after discussion with Dr. Lavell Luster   Plan:  Continue antibiotics.  I talked with IR and they do not want to repeat CT scan till Thursday or Friday.  Currently there is no reason to try and drain the right side based on studies 7/29.  If she is going to be on antibiotics she would prefer to do this at home and follow up as an outpatient.  Dr. Redmond Pulling will see and discuss this with her and her husband later today.  We can get home health if we decide that she can go home on antibiotics.  Of not she went home on Augmentin last admit.    LOS: 6 days    Deleon,Katrina 12/25/2014  OK for discharge on PO antibiotics (Cipro Flagyl) and pain meds.  Return appt for repeat CT per IR in 1 week.    I have seen and examined this patient and agree with the assessment and plan.   Matt B. Hassell Done, MD, [facility] [phone] beeper [phone]  12/25/2014 1:33 PM

## 2014-12-25 NOTE — Progress Notes (Signed)
Date:  December 25, 2014 U.R. performed for needs and level of care. Will continue to follow for Case Management needs.  Rhonda Davis, RN, BSN, CCM   [phone] 

## 2014-12-26 ENCOUNTER — Other Ambulatory Visit: Payer: Self-pay | Admitting: Radiology

## 2014-12-26 DIAGNOSIS — N739 Female pelvic inflammatory disease, unspecified: Secondary | ICD-10-CM

## 2014-12-26 LAB — CULTURE, ROUTINE-ABSCESS
Culture: NO GROWTH
Gram Stain: NONE SEEN
SPECIAL REQUESTS: NORMAL

## 2014-12-29 ENCOUNTER — Ambulatory Visit (HOSPITAL_COMMUNITY)
Admission: RE | Admit: 2014-12-29 | Discharge: 2014-12-29 | Disposition: A | Discharge status: Home / Self Care | Payer: Managed Care, Other (non HMO) | Source: Ambulatory Visit | Attending: Radiology | Admitting: Radiology

## 2014-12-29 ENCOUNTER — Other Ambulatory Visit: Payer: Self-pay | Admitting: Radiology

## 2014-12-29 DIAGNOSIS — Z9889 Other specified postprocedural states: Secondary | ICD-10-CM | POA: Diagnosis not present

## 2014-12-29 DIAGNOSIS — N739 Female pelvic inflammatory disease, unspecified: Secondary | ICD-10-CM

## 2014-12-29 MED ORDER — IOHEXOL 300 MG/ML  SOLN
100.0000 mL | Freq: Once | INTRAMUSCULAR | Status: AC | PRN
  Administered 2014-12-29: 80 mL via INTRAVENOUS

## 2016-03-11 ENCOUNTER — Encounter: Payer: Self-pay | Admitting: *Deleted

## 2016-03-11 DIAGNOSIS — N736 Female pelvic peritoneal adhesions (postinfective): Secondary | ICD-10-CM | POA: Insufficient documentation

## 2016-03-30 IMAGING — CT CT ABD-PELV W/ CM
2 of 4 series · 15 of 46 positions shown, 17 images · IV contrast (OMNIPAQUE 300)
Comparison: 07/28/2014

CLINICAL DATA: Right lower quadrant pain.

EXAM:
CT ABDOMEN AND PELVIS WITH CONTRAST
TECHNIQUE: Multidetector CT imaging of the abdomen and pelvis was performed
using the standard protocol following bolus administration of
intravenous contrast.
CONTRAST:  100mL OMNIPAQUE IOHEXOL 300 MG/ML  SOLN

[Series 2: abd/pel with · axial · 0.66mm/px · z∈[+1200,+1614]mm · 12 of 99 slices shown, 14 images]
[im 8/99  soft-tissue]
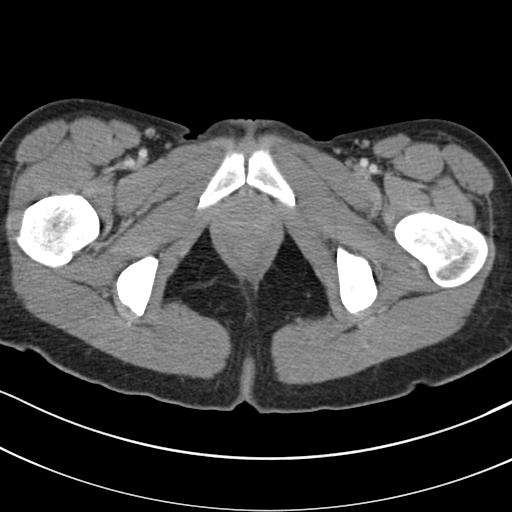
[im 8/99  bone]
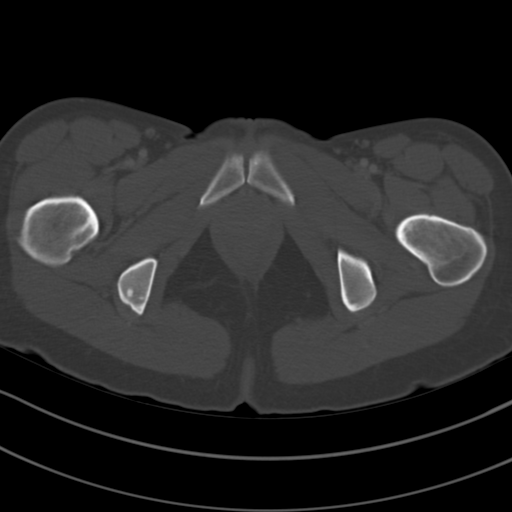
[im 16/99  soft-tissue]
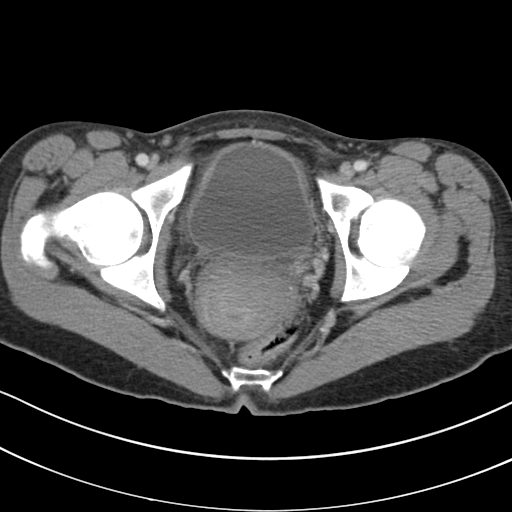
[im 23/99  soft-tissue]
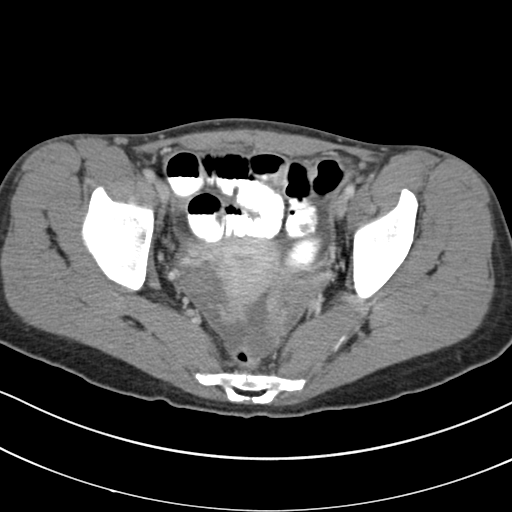
[im 31/99  soft-tissue]
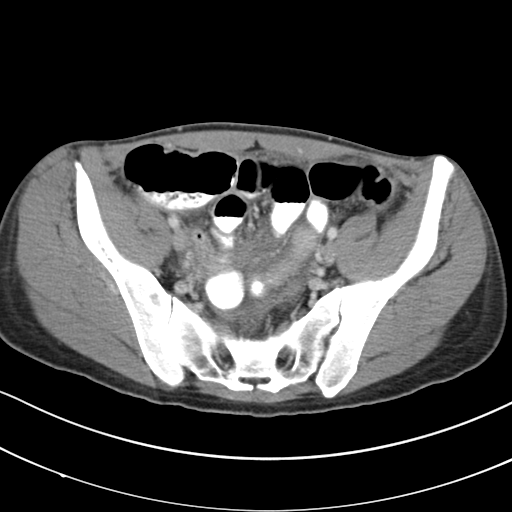
[im 38/99  soft-tissue]
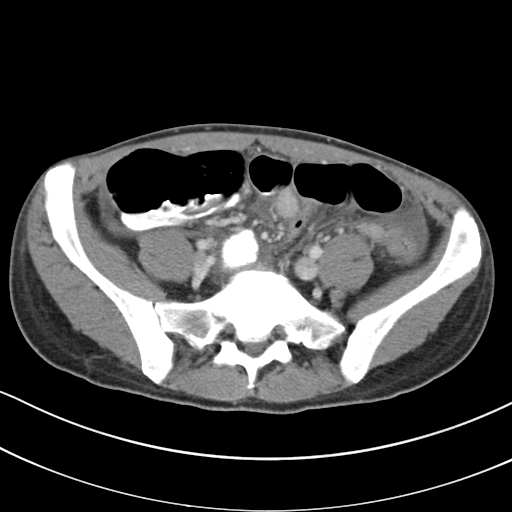
[im 46/99  soft-tissue]
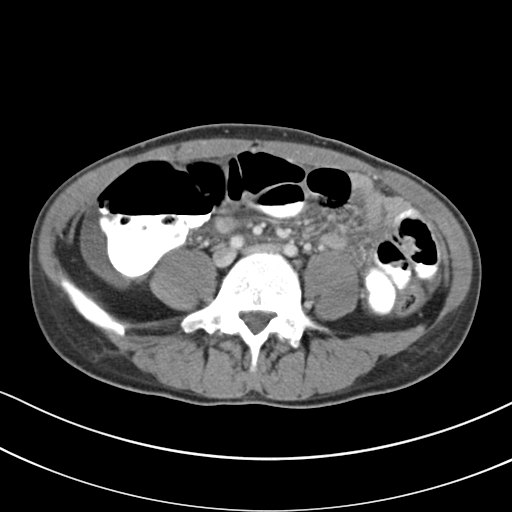
[im 53/99  soft-tissue]
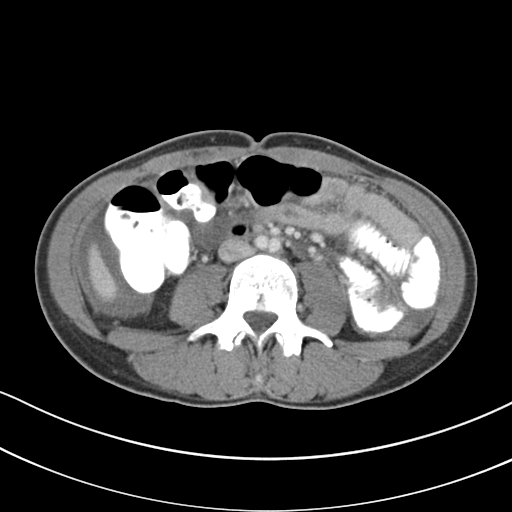
[im 61/99  soft-tissue]
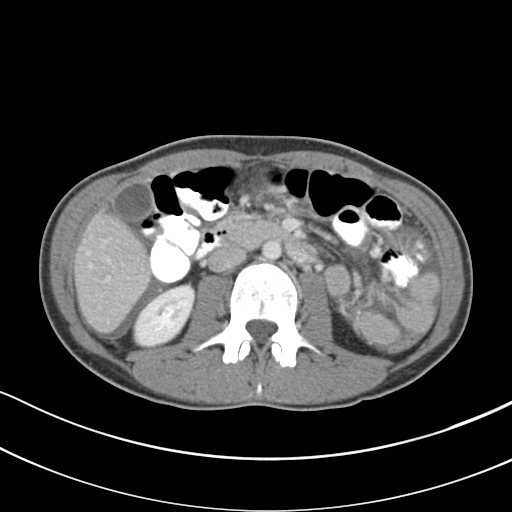
[im 68/99  soft-tissue]
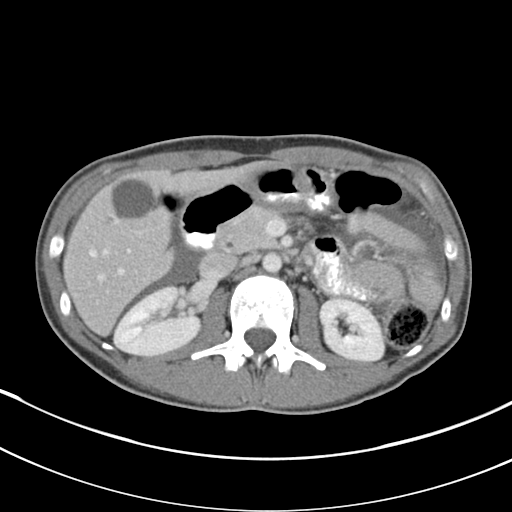
[im 68/99  bone]
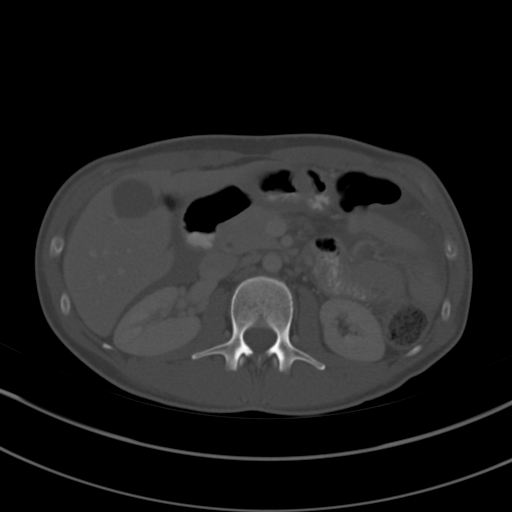
[im 76/99  soft-tissue]
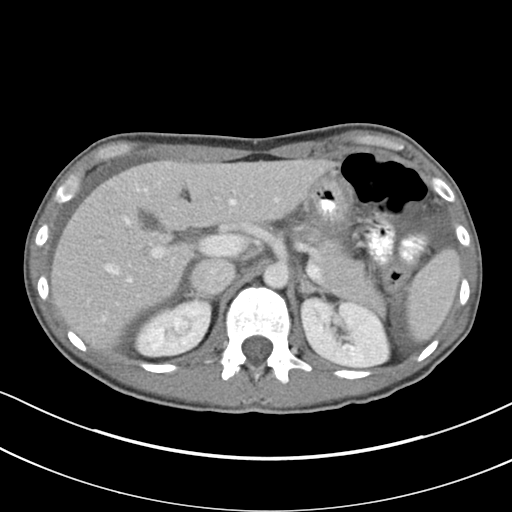
[im 83/99  soft-tissue]
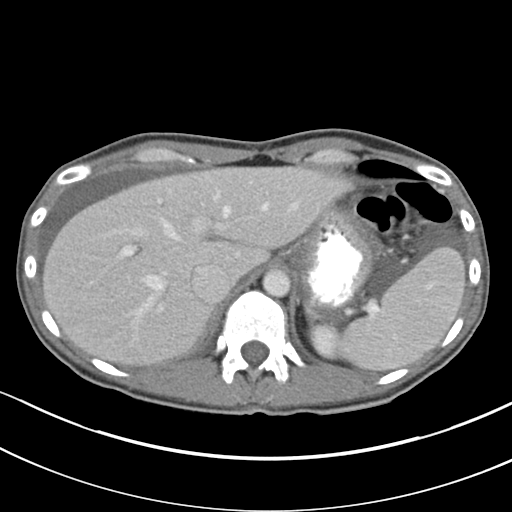
[im 91/99  soft-tissue]
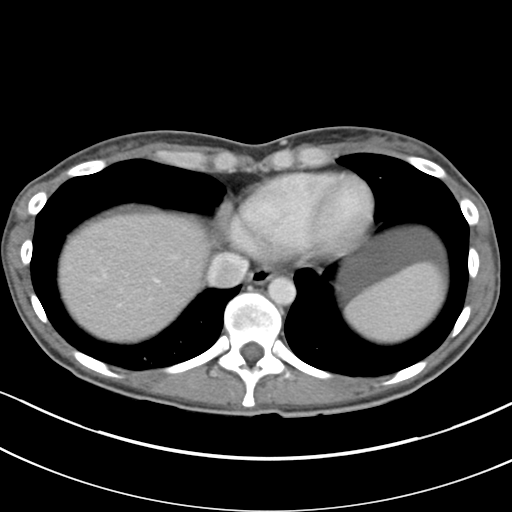

[Series 5: coronal a/|p · coronal · 0.68mm/px · 3 of 66 slices shown]
[im 22/66  soft-tissue]
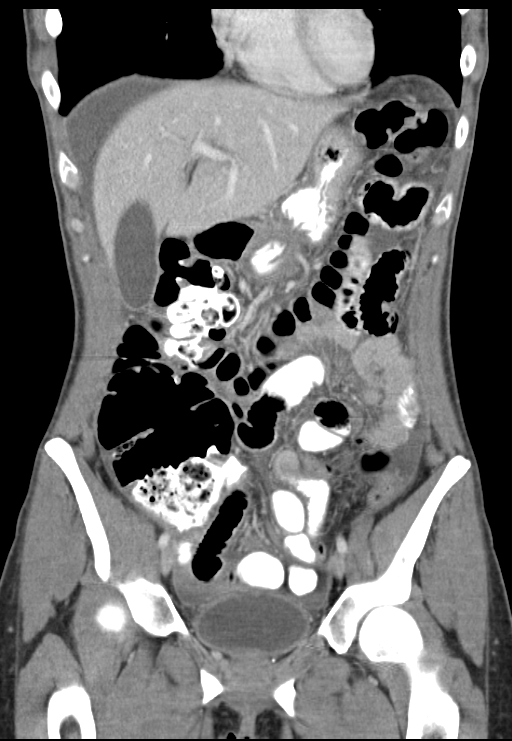
[im 29/66  soft-tissue]
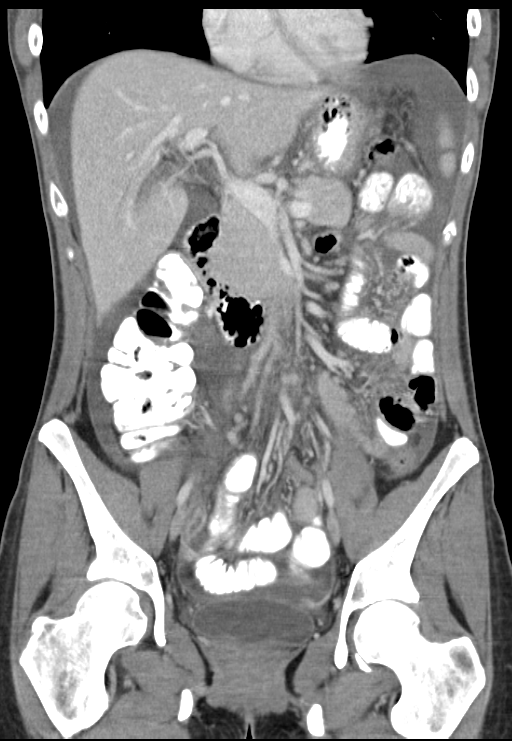
[im 37/66  soft-tissue]
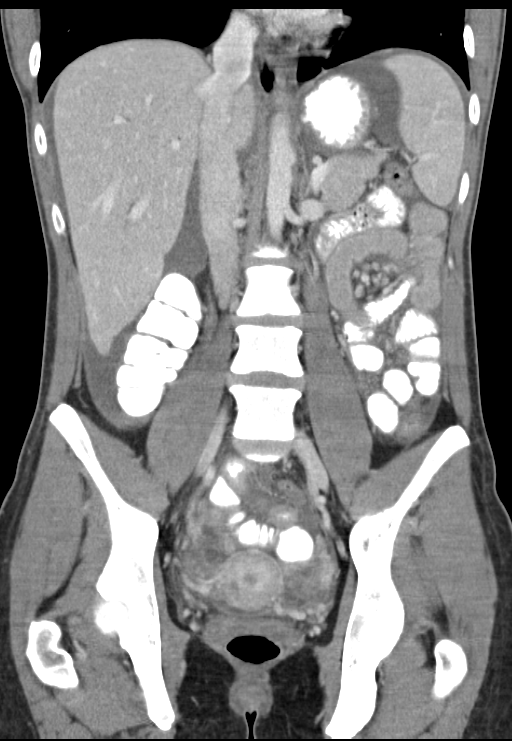

[15 of 46 positions shown; findings below may reference images not displayed]

FINDINGS: Lung bases are clear.  No effusions.  Heart is normal size.

Liver, spleen, pancreas, gallbladder, adrenals and kidneys are
unremarkable.

There is moderate free fluid around the liver and spleen as well as
in the paracolic gutters and pelvis. Appendix is visualized with a
diameter of 9 mm and suggestion of mucosal enhancement. Small amount
a gas is noted near the tip of the appendix.

Uterus is unremarkable. Cystic areas noted in the right adnexa could
reflect small ovarian cysts or hydrosalpinx.

Small bowel loops are mildly prominent throughout the abdomen and
pelvis. Several loops of small bowel in the pelvis appears thick
walled. This could reflect enteritis or secondary inflammation.
Large bowel unremarkable. Aorta is normal caliber. No free air or
adenopathy.
IMPRESSION: Appendix is mildly prominent, measuring 9 mm in diameter with
mucosal enhancement. There is moderate free fluid in the abdomen and
pelvis. Given the history of ruptured appendicitis, I cannot exclude
recurrent ruptured appendicitis. Overall appearance similar to prior
study although the free fluid is larger on today's study.

Areas of small bowel wall thickening in the pelvis could be related
to primary enteritis or secondary inflammation.

Small cystic areas within the right adnexa could reflect ovarian
cysts or hydrosalpinx.

## 2016-09-01 ENCOUNTER — Ambulatory Visit (INDEPENDENT_AMBULATORY_CARE_PROVIDER_SITE_OTHER): Payer: Managed Care, Other (non HMO) | Admitting: Physician Assistant

## 2016-09-01 ENCOUNTER — Encounter: Payer: Self-pay | Admitting: Physician Assistant

## 2016-09-01 VITALS — BP 118/7 | Ht 70.0 in | Wt 144.0 lb

## 2016-09-01 DIAGNOSIS — N6320 Unspecified lump in the left breast, unspecified quadrant: Secondary | ICD-10-CM

## 2016-09-01 NOTE — Progress Notes (Signed)
   Subjective:    Patient ID: Katrina Deleon, female    DOB: 09/21/1975, 41 y.o.   MRN: [id number]  HPI  Pt is a 41 yo female who presents to the clinic with left breast mass found this weekend. She has had left breast tenderness, shooting pain, and fullness for a few months. Denies any nipple retraction or discharge. She does have a family hx of breast cancer with her mother and paternal grandmother. She is unaware of any BRCA testing. She had one previous breast lump in right breast that was fibroadenoma. She is very concerned today.    Review of Systems See HPI.     Objective:   Physical Exam  Constitutional: She is oriented to person, place, and time. She appears well-developed and well-nourished.  Cardiovascular: Normal rate and normal heart sounds.   Pulmonary/Chest:    Neurological: She is alert and oriented to person, place, and time.  Psychiatric: She has a normal mood and affect. Her behavior is normal.          Assessment & Plan:  Marland KitchenMarland KitchenDiagnoses and all orders for this visit:  Left breast mass -     MM DIAG BREAST TOMO BILATERAL; Future -     US BREAST LTD UNI LEFT INC AXILLA; Future -     MM DIAG BREAST TOMO BILATERAL -     US BREAST LTD UNI LEFT INC AXILLA   Sent to [facility] at patient's request.  Certainly concerning with family hx. Follow up as needed.

## 2016-09-11 ENCOUNTER — Encounter: Payer: Self-pay | Admitting: Physician Assistant

## 2016-09-11 DIAGNOSIS — C50912 Malignant neoplasm of unspecified site of left female breast: Secondary | ICD-10-CM | POA: Insufficient documentation

## 2016-09-25 DIAGNOSIS — Z1501 Genetic susceptibility to malignant neoplasm of breast: Secondary | ICD-10-CM | POA: Insufficient documentation

## 2016-09-25 DIAGNOSIS — Z1509 Genetic susceptibility to other malignant neoplasm: Secondary | ICD-10-CM

## 2016-09-25 DIAGNOSIS — Z15068 Genetic susceptibility to other malignant neoplasm of digestive system: Secondary | ICD-10-CM | POA: Insufficient documentation

## 2016-10-11 DIAGNOSIS — Z9013 Acquired absence of bilateral breasts and nipples: Secondary | ICD-10-CM | POA: Insufficient documentation

## 2017-10-16 ENCOUNTER — Encounter: Payer: Self-pay | Admitting: Physician Assistant

## 2017-10-16 ENCOUNTER — Ambulatory Visit (INDEPENDENT_AMBULATORY_CARE_PROVIDER_SITE_OTHER): Payer: Managed Care, Other (non HMO) | Admitting: Physician Assistant

## 2017-10-16 VITALS — BP 120/66 | HR 67 | Ht 70.0 in | Wt 146.0 lb

## 2017-10-16 DIAGNOSIS — Z1501 Genetic susceptibility to malignant neoplasm of breast: Secondary | ICD-10-CM | POA: Diagnosis not present

## 2017-10-16 DIAGNOSIS — Z1509 Genetic susceptibility to other malignant neoplasm: Secondary | ICD-10-CM | POA: Diagnosis not present

## 2017-10-16 DIAGNOSIS — Z0000 Encounter for general adult medical examination without abnormal findings: Secondary | ICD-10-CM | POA: Diagnosis not present

## 2017-10-16 DIAGNOSIS — C50912 Malignant neoplasm of unspecified site of left female breast: Secondary | ICD-10-CM | POA: Diagnosis not present

## 2017-10-16 DIAGNOSIS — Z131 Encounter for screening for diabetes mellitus: Secondary | ICD-10-CM | POA: Diagnosis not present

## 2017-10-16 DIAGNOSIS — Z1502 Genetic susceptibility to malignant neoplasm of ovary: Secondary | ICD-10-CM

## 2017-10-16 DIAGNOSIS — Z13220 Encounter for screening for lipoid disorders: Secondary | ICD-10-CM | POA: Diagnosis not present

## 2017-10-16 LAB — CBC WITH DIFFERENTIAL/PLATELET
BASOS ABS: 42 {cells}/uL (ref 0–200)
BASOS PCT: 0.8 %
Eosinophils Absolute: 191 cells/uL (ref 15–500)
Eosinophils Relative: 3.6 %
HCT: 40.7 % (ref 35.0–45.0)
Hemoglobin: 13.7 g/dL (ref 11.7–15.5)
LYMPHS ABS: 1018 {cells}/uL (ref 850–3900)
MCH: 31.3 pg (ref 27.0–33.0)
MCHC: 33.7 g/dL (ref 32.0–36.0)
MCV: 92.9 fL (ref 80.0–100.0)
MONOS PCT: 5.1 %
MPV: 12.2 fL (ref 7.5–12.5)
NEUTROS ABS: 3779 {cells}/uL (ref 1500–7800)
Neutrophils Relative %: 71.3 %
Platelets: 183 10*3/uL (ref 140–400)
RBC: 4.38 10*6/uL (ref 3.80–5.10)
RDW: 12.9 % (ref 11.0–15.0)
Total Lymphocyte: 19.2 %
WBC mixed population: 270 cells/uL (ref 200–950)
WBC: 5.3 10*3/uL (ref 3.8–10.8)

## 2017-10-16 LAB — COMPLETE METABOLIC PANEL WITH GFR
AG Ratio: 1.7 (calc) (ref 1.0–2.5)
ALT: 30 U/L — AB (ref 6–29)
AST: 21 U/L (ref 10–30)
Albumin: 4.2 g/dL (ref 3.6–5.1)
Alkaline phosphatase (APISO): 45 U/L (ref 33–115)
BUN: 13 mg/dL (ref 7–25)
CALCIUM: 9.3 mg/dL (ref 8.6–10.2)
CHLORIDE: 104 mmol/L (ref 98–110)
CO2: 26 mmol/L (ref 20–32)
CREATININE: 0.84 mg/dL (ref 0.50–1.10)
GFR, EST AFRICAN AMERICAN: 101 mL/min/{1.73_m2} (ref >=60)
GFR, Est Non African American: 87 mL/min/{1.73_m2} (ref >=60)
GLUCOSE: 81 mg/dL (ref 65–99)
Globulin: 2.5 g/dL (calc) (ref 1.9–3.7)
Potassium: 4.1 mmol/L (ref 3.5–5.3)
Sodium: 140 mmol/L (ref 135–146)
TOTAL PROTEIN: 6.7 g/dL (ref 6.1–8.1)
Total Bilirubin: 1 mg/dL (ref 0.2–1.2)

## 2017-10-16 LAB — LIPID PANEL W/REFLEX DIRECT LDL
Cholesterol: 223 mg/dL — ABNORMAL HIGH (ref <200)
HDL: 72 mg/dL (ref >50)
LDL Cholesterol (Calc): 137 mg/dL (calc) — ABNORMAL HIGH
Non-HDL Cholesterol (Calc): 151 mg/dL (calc) — ABNORMAL HIGH (ref <130)
Total CHOL/HDL Ratio: 3.1 (calc) (ref <5.0)
Triglycerides: 50 mg/dL (ref <150)

## 2017-10-16 LAB — HEMOGLOBIN A1C
HEMOGLOBIN A1C: 5.1 %{Hb} (ref <5.7)
Mean Plasma Glucose: 100 (calc)
eAG (mmol/L): 5.5 (calc)

## 2017-10-16 LAB — TSH: TSH: 1.99 mIU/L

## 2017-10-16 NOTE — Progress Notes (Signed)
Subjective:     Katrina Deleon is a 42 y.o. female and is here for a comprehensive physical exam. The patient reports no problems.  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 In the next year she will have a hysterectomy.   She follows up with oncology every 3 months.   Social History   Socioeconomic History  . Marital status: Married    Spouse name: Not on file  . Number of children: Not on file  . Years of education: Not on file  . Highest education level: Not on file  Occupational History  . Occupation: Optometrist  Social Needs  . Financial resource strain: Not on file  . Food insecurity:    Worry: Not on file    Inability: Not on file  . Transportation needs:    Medical: Not on file    Non-medical: Not on file  Tobacco Use  . Smoking status: Never Smoker  . Smokeless tobacco: Never Used  Substance and Sexual Activity  . Alcohol use: Yes    Alcohol/week: 0.5 oz    Types: 1 Standard drinks or equivalent per week    Comment: socially   . Drug use: No  . Sexual activity: Yes    Partners: Male    Birth control/protection: None  Lifestyle  . Physical activity:    Days per week: Not on file    Minutes per session: Not on file  . Stress: Not on file  Relationships  . Social connections:    Talks on phone: Not on file    Gets together: Not on file    Attends religious service: Not on file    Active member of club or organization: Not on file    Attends meetings of clubs or organizations: Not on file    Relationship status: Not on file  . Intimate partner violence:    Fear of current or ex partner: Not on file    Emotionally abused: Not on file    Physically abused: Not on file    Forced sexual activity: Not on file  Other Topics Concern  . Not on file  Social History Narrative  . Not on file   Health Maintenance  Topic Date Due  . MAMMOGRAM  05/08/1995  . TETANUS/TDAP  05/07/1996   . PAP SMEAR  06/30/2015  . INFLUENZA VACCINE  12/24/2017  . HIV Screening  Completed    The following portions of the patient's history were reviewed and updated as appropriate: allergies, current medications, past family history, past medical history, past social history, past surgical history and problem list.  Review of Systems A comprehensive review of systems was negative.   Objective:    BP 120/66   Pulse 67   Ht _0  (1.778 m)   Wt 146 lb (66.2 kg)   BMI 20.95 kg/m  General appearance: alert, cooperative and appears stated age Head: Normocephalic, without obvious abnormality, atraumatic Eyes: conjunctivae/corneas clear. PERRL, EOM's intact. Fundi benign. Ears: normal TM's and external ear canals both ears Nose: Nares normal. Septum midline. Mucosa normal. No drainage or sinus tenderness. Throat: lips, mucosa, and tongue normal; teeth and gums normal Neck: no adenopathy, no carotid bruit, no JVD, supple, symmetrical, trachea midline and thyroid not enlarged, symmetric, no tenderness/mass/nodules Back: symmetric, no curvature. ROM normal. No CVA tenderness. Lungs: clear to auscultation bilaterally Breasts: Inspection negative, No axillary or supraclavicular adenopathy, bilateral breast implants due to masectomy.  Heart: regular rate and rhythm, S1, S2 normal, no murmur, click, rub or gallop Abdomen: soft, non-tender; bowel sounds normal; no masses,  no organomegaly and linea well healed scar down rectus abdomal muscles into umbilicus.  Extremities: extremities normal, atraumatic, no cyanosis or edema Pulses: 2+ and symmetric Skin: Skin color, texture, turgor normal. No rashes or lesions Lymph nodes: Cervical, supraclavicular, and axillary nodes normal. Neurologic: Alert and oriented X 3, normal strength and tone. Normal symmetric reflexes. Normal coordination and gait    Assessment:    Healthy female exam.      Plan:  Marland KitchenMarland KitchenLibuse was seen today for annual  exam.  Diagnoses and all orders for this visit:  Invasive ductal carcinoma of breast, female, left ([place])  BRCA1 gene mutation positive in female  Routine physical examination -     Lipid Panel w/reflex Direct LDL -     COMPLETE METABOLIC PANEL WITH GFR -     TSH -     CBC with Differential/Platelet -     Hemoglobin A1c  Screening for diabetes mellitus -     COMPLETE METABOLIC PANEL WITH GFR  Screening for lipid disorders -     Lipid Panel w/reflex Direct LDL  .[name] Depression screen [facility] 2/9 10/16/2017  Decreased Interest 0  Down, Depressed, Hopeless 0  PHQ - 2 Score 0   .. Discussed 150 minutes of exercise a week.  Encouraged vitamin D 1000 units and Calcium 1367m or 4 servings of dairy a day.  Labs ordered.  Pt aware needs TDap will look through records and see if she has had in last 10 years.   Continue to follow up every 3 months with oncology.    See After Visit Summary for Counseling Recommendations

## 2017-10-16 NOTE — Patient Instructions (Signed)

## 2017-10-20 ENCOUNTER — Telehealth: Payer: Self-pay

## 2017-10-20 NOTE — Telephone Encounter (Signed)
Katrina Deleon has paperwork. You can see her.

## 2017-10-20 NOTE — Progress Notes (Signed)
Call pt: HDL improved. TG great. Total cholesterol is elevated. LDL for risk looks ok. Watch fried/high fat foods. Continue exercising.  One liver enzyme just a hair out of normal. Not concerning.  A!C looks great. No sign of diabetes.  Thyroid looks great.

## 2017-10-30 ENCOUNTER — Encounter: Payer: Self-pay | Admitting: Physician Assistant

## 2018-06-15 DIAGNOSIS — Z1501 Genetic susceptibility to malignant neoplasm of breast: Secondary | ICD-10-CM | POA: Diagnosis not present

## 2018-06-15 DIAGNOSIS — Z1502 Genetic susceptibility to malignant neoplasm of ovary: Secondary | ICD-10-CM | POA: Diagnosis not present

## 2018-06-15 DIAGNOSIS — Z171 Estrogen receptor negative status [ER-]: Secondary | ICD-10-CM | POA: Diagnosis not present

## 2018-06-15 DIAGNOSIS — C50412 Malignant neoplasm of upper-outer quadrant of left female breast: Secondary | ICD-10-CM | POA: Diagnosis not present

## 2018-06-15 DIAGNOSIS — Z9013 Acquired absence of bilateral breasts and nipples: Secondary | ICD-10-CM | POA: Diagnosis not present

## 2018-06-22 DIAGNOSIS — M542 Cervicalgia: Secondary | ICD-10-CM | POA: Diagnosis not present

## 2018-06-22 DIAGNOSIS — C50412 Malignant neoplasm of upper-outer quadrant of left female breast: Secondary | ICD-10-CM | POA: Diagnosis not present

## 2018-06-22 DIAGNOSIS — Z171 Estrogen receptor negative status [ER-]: Secondary | ICD-10-CM | POA: Diagnosis not present

## 2018-06-22 DIAGNOSIS — Z1509 Genetic susceptibility to other malignant neoplasm: Secondary | ICD-10-CM | POA: Diagnosis not present

## 2018-06-22 DIAGNOSIS — Z1501 Genetic susceptibility to malignant neoplasm of breast: Secondary | ICD-10-CM | POA: Diagnosis not present

## 2018-06-22 DIAGNOSIS — Z1502 Genetic susceptibility to malignant neoplasm of ovary: Secondary | ICD-10-CM | POA: Diagnosis not present

## 2018-06-28 ENCOUNTER — Encounter: Payer: Self-pay | Admitting: Obstetrics & Gynecology

## 2018-06-28 ENCOUNTER — Ambulatory Visit (INDEPENDENT_AMBULATORY_CARE_PROVIDER_SITE_OTHER): Payer: BLUE CROSS/BLUE SHIELD | Admitting: Obstetrics & Gynecology

## 2018-06-28 VITALS — BP 107/63 | HR 73 | Resp 16 | Ht 71.0 in | Wt 140.0 lb

## 2018-06-28 DIAGNOSIS — Z1501 Genetic susceptibility to malignant neoplasm of breast: Secondary | ICD-10-CM

## 2018-06-28 DIAGNOSIS — Z1151 Encounter for screening for human papillomavirus (HPV): Secondary | ICD-10-CM | POA: Diagnosis not present

## 2018-06-28 DIAGNOSIS — C50912 Malignant neoplasm of unspecified site of left female breast: Secondary | ICD-10-CM | POA: Diagnosis not present

## 2018-06-28 DIAGNOSIS — Z124 Encounter for screening for malignant neoplasm of cervix: Secondary | ICD-10-CM

## 2018-06-28 DIAGNOSIS — Z1502 Genetic susceptibility to malignant neoplasm of ovary: Secondary | ICD-10-CM | POA: Diagnosis not present

## 2018-06-28 DIAGNOSIS — Z1509 Genetic susceptibility to other malignant neoplasm: Secondary | ICD-10-CM

## 2018-06-28 DIAGNOSIS — Z01419 Encounter for gynecological examination (general) (routine) without abnormal findings: Secondary | ICD-10-CM

## 2018-06-28 NOTE — Progress Notes (Addendum)
Subjective:     Katrina Deleon is a 43 y.o. female here for a routine exam.  Current complaints: Patient unhappy with breast surveillance after diagnosis of triple negative breast cancer.  See below.  Patient still menstruating regularly.  Patient is determining whether she will have her ovaries removed.  She would like surveillance with Ca1 25 and ultrasounds until she makes her decision.   Gynecologic History Patient's last menstrual period was 06/21/2018. Contraception: none Last Pap: 2014. Results were: normal Patient followed by [facility] for triple negative breast cancer and BR CA 1+.  Patient had bilateral mastectomies with implants.  She says [facility] is not using any radiological scans for recurrence's surveillance.  Patient would like ultrasounds of her breast.  Her mother has the same diagnosis and is getting ultrasounds to check for recurrence.  Obstetric History OB History  Gravida Para Term Preterm AB Living  0 0 0 0 0 0  SAB TAB Ectopic Multiple Live Births  0 0 0 0       The following portions of the patient's history were reviewed and updated as appropriate: allergies, current medications, past family history, past medical history, past social history, past surgical history and problem list.  Review of Systems Pertinent items noted in HPI and remainder of comprehensive ROS otherwise negative.    Objective:      Vitals:   06/28/18 0810  BP: 107/63  Pulse: 73  Resp: 16  Weight: 140 lb (63.5 kg)  Height: '5\' 11"'$  (1.803 m)   Vitals:  WNL General appearance: alert, cooperative and no distress  HEENT: Normocephalic, without obvious abnormality, atraumatic Eyes: negative Throat: lips, mucosa, and tongue normal; teeth and gums normal  Respiratory: Clear to auscultation bilaterally  CV: Regular rate and rhythm  Breasts:  S/P mastectomy with reconstruction.  No masses felt in chest wall or axilla  GI: Soft, non-tender; bowel sounds  normal; no masses,  no organomegaly  GU: External Genitalia:  Tanner V, no lesion Urethra:  No prolapse   Vagina: Pink, normal rugae, no blood or discharge  Cervix: No CMT, no lesion  **Cervix is posterior and points toward sacrum  Uterus:  Normal size and contour, non tender  Adnexa: Normal, no masses, non tender  Musculoskeletal: No edema, redness or tenderness in the calves or thighs  Skin: No lesions or rash  Lymphatic: Axillary adenopathy: none     Psychiatric: Normal mood and behavior        Assessment:    Healthy female exam.   BRCA 1 + Triple negative breast cancer--s/p mastectomy and reconstruction   Plan:    1.  Pap smear with cotesting today 2.  BRCA1 positive-- will screen ovaries today with Ca1 25 and pelvic ultrasonography.  Pt deciding when to have her ovaries removed (and left fallopian tube).  Chart routed to Dr. Denman George for potential referral 3.  Triple negative breast cancer-- patient would like second opinion.  I suggested she see Dr. Serita Grammes.  She would like to have some surveillance of her breast either with ultrasound or MRI.  [facility] has been resistant to order these test as it does not follow their protocol.  Patient is confused that her mother is receiving the screening tests in [place].  I think it is reasonable to consider patient wishes as well as protocols.  Will consult with Dr. Serita Grammes and come up with best plan. 4.  Pt aware she can still become pregnant since she  is menstruating each month.  She opts for no birth control at this time.

## 2018-06-30 ENCOUNTER — Ambulatory Visit (HOSPITAL_COMMUNITY)
Admission: RE | Admit: 2018-06-30 | Discharge: 2018-06-30 | Disposition: A | Discharge status: Home / Self Care | Payer: BLUE CROSS/BLUE SHIELD | Source: Ambulatory Visit | Attending: Obstetrics & Gynecology | Admitting: Obstetrics & Gynecology

## 2018-06-30 ENCOUNTER — Telehealth: Payer: Self-pay | Admitting: *Deleted

## 2018-06-30 DIAGNOSIS — C50919 Malignant neoplasm of unspecified site of unspecified female breast: Secondary | ICD-10-CM | POA: Diagnosis not present

## 2018-06-30 DIAGNOSIS — Z1501 Genetic susceptibility to malignant neoplasm of breast: Secondary | ICD-10-CM | POA: Insufficient documentation

## 2018-06-30 DIAGNOSIS — Z1509 Genetic susceptibility to other malignant neoplasm: Secondary | ICD-10-CM | POA: Insufficient documentation

## 2018-06-30 LAB — CYTOLOGY - PAP: HPV: NOT DETECTED

## 2018-06-30 LAB — CA 125: CA 125: 13 U/mL (ref <35)

## 2018-06-30 NOTE — Telephone Encounter (Signed)
-----   Message from Guss Bunde, MD sent at 06/30/2018  4:23 PM EST ----- Normal US--RN to call (no my chart)

## 2018-06-30 NOTE — Telephone Encounter (Signed)
LM on voicemail that her CA-125 and TVU were both normal per Dr Gala Romney.

## 2018-07-01 ENCOUNTER — Encounter: Payer: Self-pay | Admitting: Obstetrics & Gynecology

## 2018-07-01 DIAGNOSIS — R87619 Unspecified abnormal cytological findings in specimens from cervix uteri: Secondary | ICD-10-CM | POA: Insufficient documentation

## 2018-07-05 ENCOUNTER — Other Ambulatory Visit: Payer: Self-pay | Admitting: *Deleted

## 2018-07-05 DIAGNOSIS — R221 Localized swelling, mass and lump, neck: Secondary | ICD-10-CM | POA: Diagnosis not present

## 2018-07-06 ENCOUNTER — Telehealth: Payer: Self-pay | Admitting: *Deleted

## 2018-07-06 DIAGNOSIS — Z853 Personal history of malignant neoplasm of breast: Secondary | ICD-10-CM

## 2018-07-06 NOTE — Telephone Encounter (Signed)
LM on voicemail to call office concerning her recent pap smear.  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  Also, referral to [facility] placed into epic.

## 2018-07-06 NOTE — Telephone Encounter (Signed)
-----   Message from Guss Bunde, MD sent at 07/01/2018  4:56 PM EST ----- AGUS pap.  Patient needs colpo with ECC and endometrial biopsy since >36.  Pt will need phone call and encourage to sign up for my chart.

## 2018-07-12 ENCOUNTER — Ambulatory Visit: Payer: BLUE CROSS/BLUE SHIELD | Admitting: Obstetrics & Gynecology

## 2018-07-12 ENCOUNTER — Encounter: Payer: Self-pay | Admitting: Obstetrics & Gynecology

## 2018-07-12 VITALS — BP 106/64 | HR 76 | Resp 16 | Ht 71.0 in | Wt 140.0 lb

## 2018-07-12 DIAGNOSIS — Z01812 Encounter for preprocedural laboratory examination: Secondary | ICD-10-CM

## 2018-07-12 DIAGNOSIS — Z3202 Encounter for pregnancy test, result negative: Secondary | ICD-10-CM

## 2018-07-12 DIAGNOSIS — N840 Polyp of corpus uteri: Secondary | ICD-10-CM | POA: Diagnosis not present

## 2018-07-12 DIAGNOSIS — N870 Mild cervical dysplasia: Secondary | ICD-10-CM | POA: Diagnosis not present

## 2018-07-12 LAB — POCT URINE PREGNANCY: Preg Test, Ur: NEGATIVE

## 2018-07-12 NOTE — Patient Instructions (Signed)
Cervical Dysplasia  Cervical dysplasia is a condition in which a woman's cervix cells have abnormal changes. The cervix is the opening of the uterus (womb). It is located between the vagina and the uterus. Cervical dysplasia may be an early sign of cervical cancer. If left untreated, this condition may become more severe and may progress to cervical cancer. Early detection, treatment, and follow-up care are very important. What are the causes? Cervical dysplasia can be caused by a human papillomavirus (HPV) infection. HPV is the most common sexually transmitted infection (STI). HPV is spread from person to person through sexual contact. This includes oral, vaginal, or anal sex. What increases the risk? The following factors may make you more likely to develop this condition:  Having had a sexually transmitted infection (STI), such as herpes, chlamydia or HPV.  Becoming sexually active before age 31.  Having had more than one sexual partner.  Having a sexual partner who has multiple sexual partners.  Not using protection, such as a condom, during sex, especially with new sexual partners.  Having a history of cancer of the vagina or vulva.  Having a weakened body defense (immune) system.  Being the daughter of a woman who took diethylstilbestrol (DES) during pregnancy.  Having a family history of cervical cancer.  Smoking.  Using oral contraceptives, also called birth control pills.  Having had three or more full-term pregnancies. What are the signs or symptoms? There are usually no symptoms of this condition. If you do have symptoms, they may include:  Abnormal vaginal discharge.  Bleeding between periods or after sex.  Bleeding during menopause.  Pain during sex (dyspareunia). How is this diagnosed? A test called a Pap test may be done. During this test, cells are taken from the cervix and then examined under a microscope. A test in which tissue is removed from the cervix  (biopsy) may also be done if the Pap test is abnormal or if the cervix looks abnormal. How is this treated? Treatment varies based on the severity of the condition. Treatment may include:  Cryotherapy. During cryotherapy, the abnormal cells are frozen with a steel-tip instrument.  Loop electrosurgical excision procedure (LEEP). This procedure removes abnormal tissue from the cervix.  Surgery to remove abnormal tissue. This is usually done in more advanced cases. Surgical options include: ? A cone biopsy. This is a procedure in which the cervical canal and a portion of the center of the cervix are removed. ? Hysterectomy. This is a surgery in which the uterus and cervix are removed. Follow these instructions at home:  Take over-the-counter and prescription medicines only as told by your health care provider.  Do not use tampons, have sex, or douche until your health care provider says it is okay.  Keep follow-up visits as told by your health care provider. This is important. Women who have been treated for cervical dysplasia should have regular pelvic exams and Pap tests as told by their health care provider. How is this prevented?  Practice safe sex to help prevent sexually transmitted infections (STI) that may cause this condition.  Have regular Pap tests. Talk with your health care provider about how often you need these tests. Pap tests will help identify cell changes that can lead to cancer. Contact a health care provider if:  You develop genital warts. Get help right away if:  You have a fever.  You have abnormal vaginal discharge.  Your menstrual period is heavier than normal.  You develop bright red bleeding.  This may include blood clots.  You have pain or cramps that get worse, and medicine does not help to relieve your pain.  You feel light-headed and you are unusually weak.  You have fainting spells.  You have pain in the abdomen. Summary  Cervical dysplasia is  a condition in which a woman's cervix cells have abnormal changes.  If left untreated, this condition may become more severe and may progress to cervical cancer.  Early detection, treatment, and follow-up care are very important in managing this condition.  Have regular pelvic exams and Pap tests. Talk with your health care provider about how often you need these tests. Pap tests will help identify cell changes that can lead to cancer. This information is not intended to replace advice given to you by your health care provider. Make sure you discuss any questions you have with your health care provider. Document Released: 05/12/2005 Document Revised: 05/15/2016 Document Reviewed: 05/15/2016 Elsevier Interactive Patient Education  2019 [place] Electrosurgical Excision Procedure Loop electrosurgical excision procedure (LEEP) is the cutting and removal (excision) of part of the cervix. The cervix is the bottom part of the uterus that opens into the vagina. The tissue that is removed from the cervix is then examined to see if there are precancerous cells or cancer cells present. LEEP may be done when:  You have abnormal bleeding from your cervix.  You have an abnormal Pap test result.  Your health care provider finds an abnormality on your cervix during a pelvic exam. LEEP typically only takes a few minutes and is often done in the health care provider's office. The procedure is safe for women who are trying to get pregnant. However, the procedure is usually not done during a menstrual period or during pregnancy. Tell a health care provider about:  Any allergies you have.  All medicines you are taking, including vitamins, herbs, eye drops, creams, and over-the-counter medicines.  Any problems you or family members have had with anesthetic medicines.  Any blood disorders you have.  Any surgeries you have had.  Any medical conditions you have, including current or past vaginal  infections, such as herpes or sexually transmitted infections (STIs).  Whether you are pregnant or may be pregnant. What are the risks? Generally, this is a safe procedure. However, problems may occur, including:  Infection.  Bleeding.  Allergic reactions to medicines.  Changes or scarring in the cervix.  Increased risk of early (preterm) labor in future pregnancies. What happens before the procedure?  Ask your health care provider about: ? Changing or stopping your regular medicines. This is especially important if you are taking diabetes medicines or blood thinners. ? Taking medicines such as aspirin and ibuprofen. These medicines can thin your blood. Do not take these medicines before your procedure if your health care provider instructs you not to.  Your health care provider may recommend that you take pain medicine before the procedure.  Plan to have someone take you home after the procedure. What happens during the procedure?  An instrument called a speculum will be placed in your vagina. This will allow your health care provider to see the cervix.  You will be given a medicine to numb the area (local anesthetic). The medicine will be injected into your cervix and the surrounding area.  A solution will be applied to your cervix. This solution will help the health care provider find the abnormal cells that need to be removed.  A thin wire loop will  be passed through your vagina. The wire will be used to burn (cauterize) the cervical tissue with an electrical current.  The abnormal cervical tissue will be removed.  Any open blood vessels will be cauterized to prevent bleeding.  A paste may be applied to the cauterized area of your cervix to help prevent bleeding.  The sample of cervical tissue will be examined under a microscope. The procedure may vary among health care providers and hospitals. What happens after the procedure?  You may have mild abdominal  cramping.  You may have a small amount of bleeding (spotting) from the vagina.  You may have a dark-colored discharge coming from your vagina. This is from the paste that was used on the cervix to prevent bleeding.  Your health care provider may recommend pelvic rest. Pelvic rest generally means avoiding sex and not putting anything in the vagina, such as tampons, creams, and douches.  It is your responsibility to get your test results. Ask your health care provider or the department performing the test when your results will be ready. This information is not intended to replace advice given to you by your health care provider. Make sure you discuss any questions you have with your health care provider. Document Released: 08/02/2002 Document Revised: 06/08/2015 Document Reviewed: 03/26/2015 Elsevier Interactive Patient Education  2019 [employer].

## 2018-07-12 NOTE — Progress Notes (Signed)
Colposcopy Procedure Note  Indications: Pap smear 1 months ago showed: glandular cell abnormality (AGUS). The prior pap showed no abnormalities. (negative co-testing in 2014) Prior cervical/vaginal disease: none  Procedure Details  The risks and benefits of the procedure and Written informed consent obtained.  Speculum placed in vagina and excellent visualization of cervix achieved, cervix swabbed x 3 with acetic acid solution.  Findings: Cervix: acetowhite lesion(s) noted at 12 o'clock and from 4-9 o'clock; cervix swabbed with Lugol's solution, SCJ visualized and biopsies taken at 12, 6, & 9 o'clock, endocervical curettage performed, endometrial biopsy performed, specimen labelled and sent to pathology and hemostasis achieved with Monsel's solution. Vaginal inspection: vaginal colposcopy not performed. Vulvar colposcopy: vulvar colposcopy not performed.  Specimens: 3 cervical biopsies--12, 6, and 9, ECC, and EMBx  Complications: none.  Plan: Specimens labelled and sent to Pathology. Will base further treatment on Pathology findings. Discussed possible treatment options (15 minutes of discussion--face to face)  ENDOMETRIAL BIOPSY     The indications for endometrial biopsy were reviewed.   Risks of the biopsy including cramping, bleeding, infection, uterine perforation, inadequate specimen and need for additional procedures  were discussed. The patient states she understands and agrees to undergo procedure today. Consent was signed. Time out was performed. Urine HCG was negative. A sterile speculum was placed in the patient's vagina and the cervix was prepped with Betadine. A single-toothed tenaculum was placed on the anterior lip of the cervix to stabilize it. The 3 mm pipelle was introduced into the endometrial cavity without difficulty to a depth of 8cm, and a moderate amount of tissue was obtained and sent to pathology. The instruments were removed from the patient's vagina. Minimal  bleeding from the cervix was noted. The patient tolerated the procedure well. Routine post-procedure instructions were given to the patient. The patient will follow up to review the results and for further management.

## 2018-08-02 ENCOUNTER — Other Ambulatory Visit: Payer: Self-pay | Admitting: *Deleted

## 2018-08-02 ENCOUNTER — Encounter: Payer: Self-pay | Admitting: *Deleted

## 2018-08-02 DIAGNOSIS — N840 Polyp of corpus uteri: Secondary | ICD-10-CM

## 2018-08-10 ENCOUNTER — Other Ambulatory Visit: Payer: Self-pay | Admitting: Obstetrics & Gynecology

## 2018-08-10 DIAGNOSIS — N840 Polyp of corpus uteri: Secondary | ICD-10-CM

## 2018-08-11 ENCOUNTER — Encounter: Payer: Self-pay | Admitting: Obstetrics & Gynecology

## 2018-08-11 DIAGNOSIS — N840 Polyp of corpus uteri: Secondary | ICD-10-CM | POA: Insufficient documentation

## 2018-08-17 ENCOUNTER — Other Ambulatory Visit: Payer: Self-pay | Admitting: Obstetrics & Gynecology

## 2018-08-17 DIAGNOSIS — N840 Polyp of corpus uteri: Secondary | ICD-10-CM

## 2018-08-26 ENCOUNTER — Other Ambulatory Visit: Payer: Self-pay | Admitting: Obstetrics & Gynecology

## 2018-08-26 DIAGNOSIS — N840 Polyp of corpus uteri: Secondary | ICD-10-CM

## 2018-12-31 ENCOUNTER — Encounter

## 2019-01-24 ENCOUNTER — Encounter: Payer: Self-pay | Admitting: Obstetrics & Gynecology

## 2019-01-24 ENCOUNTER — Ambulatory Visit (INDEPENDENT_AMBULATORY_CARE_PROVIDER_SITE_OTHER): Payer: 59 | Admitting: Obstetrics & Gynecology

## 2019-01-24 ENCOUNTER — Other Ambulatory Visit: Payer: Self-pay

## 2019-01-24 VITALS — BP 112/69 | HR 67 | Ht 71.0 in | Wt 147.0 lb

## 2019-01-24 DIAGNOSIS — Z1501 Genetic susceptibility to malignant neoplasm of breast: Secondary | ICD-10-CM | POA: Diagnosis not present

## 2019-01-24 DIAGNOSIS — N840 Polyp of corpus uteri: Secondary | ICD-10-CM | POA: Diagnosis not present

## 2019-01-24 DIAGNOSIS — R87619 Unspecified abnormal cytological findings in specimens from cervix uteri: Secondary | ICD-10-CM | POA: Diagnosis not present

## 2019-01-24 DIAGNOSIS — Z1509 Genetic susceptibility to other malignant neoplasm: Secondary | ICD-10-CM | POA: Diagnosis not present

## 2019-01-27 NOTE — Progress Notes (Signed)
   Subjective:    Patient ID: Katrina Deleon, female    DOB: 03-19-1976, 43 y.o.   MRN: [id number]  HPI  43 yo female presents for discussion of pap smear, colpo, and endometrial biopsy.  Pt also followed by [facility] for BRCA positive and needing BSO.  Pt is weighing waiting a few years before BSO to avoid early menopause and associated issues.    Review of Systems  Constitutional: Negative.   Respiratory: Negative.   Cardiovascular: Negative.   Gastrointestinal: Negative.   Genitourinary: Negative.        Objective:   Physical Exam Vitals signs reviewed.  Constitutional:      General: She is not in acute distress.    Appearance: She is well-developed.  HENT:     Head: Normocephalic and atraumatic.  Eyes:     Conjunctiva/sclera: Conjunctivae normal.  Cardiovascular:     Rate and Rhythm: Normal rate.  Pulmonary:     Effort: Pulmonary effort is normal.  Skin:    General: Skin is warm and dry.  Neurological:     Mental Status: She is alert and oriented to person, place, and time.    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  1.  Pt still weighing options for BSO for prevention of ovarian cancer. 2.  Pt's US showed 6 mm lining and polyp not appreciated.  We discussed the overlap pathology results of polyp and polypoid endometrium as well as the endometrial biopsy can disrupt a polyp and lead to resolution.  Will get a saline sono with Dr. Darreld Mclean to see if there is a polyp and treat based on results.  Pt not having any spotting which is reassuring.   3.  AGUS NOS pap.  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  Pt due for pap 12 months after colpo (Feb 2021).  25 mins espent face to face with patient with >50% counseling.

## 2019-07-25 ENCOUNTER — Ambulatory Visit: Payer: 59 | Admitting: Obstetrics & Gynecology

## 2019-08-08 ENCOUNTER — Ambulatory Visit (INDEPENDENT_AMBULATORY_CARE_PROVIDER_SITE_OTHER): Payer: 59 | Admitting: Obstetrics & Gynecology

## 2019-08-08 ENCOUNTER — Other Ambulatory Visit: Payer: Self-pay

## 2019-08-08 ENCOUNTER — Encounter: Payer: Self-pay | Admitting: Obstetrics & Gynecology

## 2019-08-08 VITALS — BP 106/59 | HR 73 | Temp 98.2°F | Resp 16 | Ht 71.0 in | Wt 152.0 lb

## 2019-08-08 DIAGNOSIS — R87619 Unspecified abnormal cytological findings in specimens from cervix uteri: Secondary | ICD-10-CM | POA: Diagnosis not present

## 2019-08-08 DIAGNOSIS — Z01411 Encounter for gynecological examination (general) (routine) with abnormal findings: Secondary | ICD-10-CM

## 2019-08-08 DIAGNOSIS — Z124 Encounter for screening for malignant neoplasm of cervix: Secondary | ICD-10-CM | POA: Diagnosis not present

## 2019-08-08 DIAGNOSIS — R102 Pelvic and perineal pain: Secondary | ICD-10-CM

## 2019-08-08 DIAGNOSIS — Z1501 Genetic susceptibility to malignant neoplasm of breast: Secondary | ICD-10-CM

## 2019-08-08 DIAGNOSIS — Z1509 Genetic susceptibility to other malignant neoplasm: Secondary | ICD-10-CM

## 2019-08-08 NOTE — Progress Notes (Signed)
Subjective:     Katrina Deleon is a 44 y.o. female here for a routine exam.  Current complaints: left lower quadrant pain and thinks she feels a mass.     Gynecologic History Patient's last menstrual period was 07/22/2019. 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  Last mammogram: not getting due to mastectomy and reconstruction.  Pt has not seen oncologist in 1 year.  I encouraged her to follow up.  Obstetric History OB History  Gravida Para Term Preterm AB Living  0 0 0 0 0 0  SAB TAB Ectopic Multiple Live Births  0 0 0 0     The following portions of the patient's history were reviewed and updated as appropriate: allergies, current medications, past family history, past medical history, past social history, past surgical history and problem list.  Review of Systems Pertinent items noted in HPI and remainder of comprehensive ROS otherwise negative.    Objective:      Vitals:   08/08/19 1535  BP: (!) 106/59  Pulse: 73  Resp: 16  Temp: 98.2 F (36.8 C)  Weight: 152 lb (68.9 kg)  Height: 5' 11" (1.803 m)   Vitals:  WNL General appearance: alert, cooperative and no distress  HEENT: Normocephalic, without obvious abnormality, atraumatic Eyes: negative Throat: lips, mucosa, and tongue normal; teeth and gums normal  Respiratory: Clear to auscultation bilaterally  CV: Regular rate and rhythm  Breasts:  S/P mastectomy with reconstruction.  No masses felt over inplants.  No lymphadenopathy.  GI: Soft, non-tender; bowel sounds normal; no masses,  no organomegaly  GU: External Genitalia:  Tanner V, no lesion Urethra:  No prolapse   Vagina: Pink, normal rugae, no blood or discharge  Cervix: No CMT, no lesion  Uterus:  Normal size and contour, non tender  Adnexa: Normal, left ovary feels larger than right ovary.  Musculoskeletal: No edema, redness or tenderness in the calves or thighs  Skin: No  lesions or rash  Lymphatic: Axillary adenopathy: none     Psychiatric: Normal mood and behavior    Assessment:    Healthy female exam.    Plan:   Pap with cotesting Pt to follow up with breast oncologist Pelvis complete with TVUS.   Pt advised to eat diet low in fiber on 24 hour prior to [place] and decrease gas to have best [place]. 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

## 2019-08-09 DIAGNOSIS — Z01419 Encounter for gynecological examination (general) (routine) without abnormal findings: Secondary | ICD-10-CM | POA: Insufficient documentation

## 2019-08-10 LAB — CYTOLOGY - PAP
Comment: NEGATIVE
Diagnosis: NEGATIVE
High risk HPV: NEGATIVE

## 2019-08-10 LAB — CA 125: CA 125: 15 U/mL (ref <35)

## 2019-08-11 ENCOUNTER — Other Ambulatory Visit: Payer: Self-pay

## 2019-08-11 ENCOUNTER — Ambulatory Visit (INDEPENDENT_AMBULATORY_CARE_PROVIDER_SITE_OTHER): Payer: 59

## 2019-08-11 ENCOUNTER — Other Ambulatory Visit: Payer: 59

## 2019-08-11 DIAGNOSIS — R102 Pelvic and perineal pain: Secondary | ICD-10-CM

## 2019-08-17 ENCOUNTER — Encounter: Payer: Self-pay | Admitting: Obstetrics & Gynecology

## 2019-08-17 ENCOUNTER — Telehealth: Payer: Self-pay | Admitting: *Deleted

## 2019-08-17 DIAGNOSIS — N838 Other noninflammatory disorders of ovary, fallopian tube and broad ligament: Secondary | ICD-10-CM | POA: Insufficient documentation

## 2019-08-17 NOTE — Telephone Encounter (Signed)
Attempted to reach the patient to schedule an appt; left message to call the office back. Patient needs a new patient appt with Dr Denman George on 4/13

## 2019-08-18 ENCOUNTER — Telehealth: Payer: Self-pay | Admitting: *Deleted

## 2019-08-18 NOTE — Telephone Encounter (Signed)
Called and left the patient a message to call the office back; need to schedule a new patient appt

## 2019-08-19 ENCOUNTER — Telehealth: Payer: Self-pay | Admitting: *Deleted

## 2019-08-19 NOTE — Telephone Encounter (Signed)
Called and left the patient a message to call the office back to schedule an appt

## 2019-08-22 ENCOUNTER — Telehealth: Payer: Self-pay | Admitting: *Deleted

## 2019-08-22 NOTE — Telephone Encounter (Signed)
Sent Dr Denman George and Dr Gala Romney an in basket message stating "I have made three attempts to reach the patient to schedule an appt. Left three message to call the office. Patient has not returned any calls."

## 2019-11-01 IMAGING — US US PELVIS COMPLETE WITH TRANSVAGINAL
1 series · 15 of 25 positions shown · non-contrast
Comparison: 12/20/2014

CLINICAL DATA: BRCA 1 gene mutation positive. Breast carcinoma. LMP
06/21/2018.

EXAM:
TRANSABDOMINAL AND TRANSVAGINAL ULTRASOUND OF PELVIS
TECHNIQUE: Both transabdominal and transvaginal ultrasound examinations of the
pelvis were performed. Transabdominal technique was performed for
global imaging of the pelvis including uterus, ovaries, adnexal
regions, and pelvic cul-de-sac. It was necessary to proceed with
endovaginal exam following the transabdominal exam to visualize the
endometrial stripe and ovaries.

[Series 1: us pelvis complete with transvaginal · 15 of 89 slices shown]
[im 1/89]
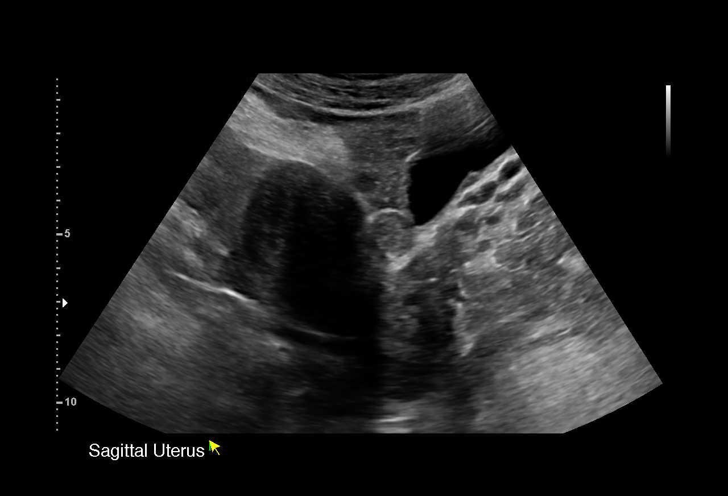
[im 8/89]
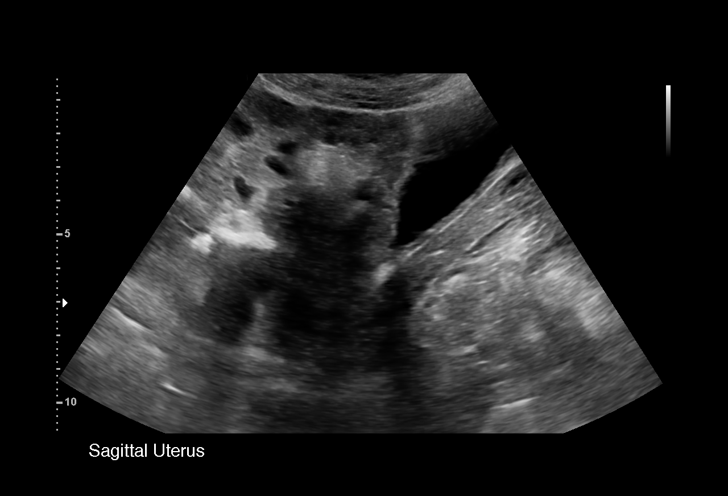
[im 15/89]
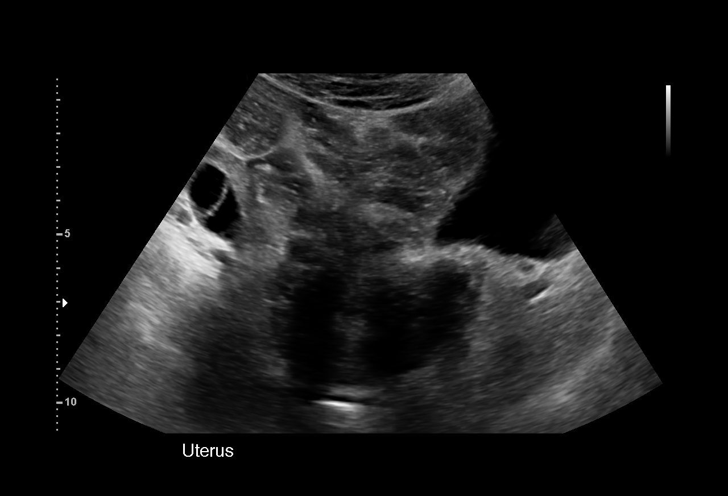
[im 19/89]
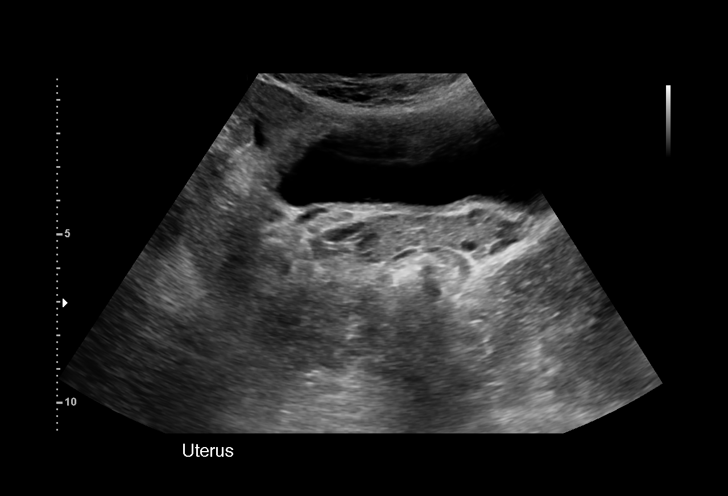
[im 26/89]
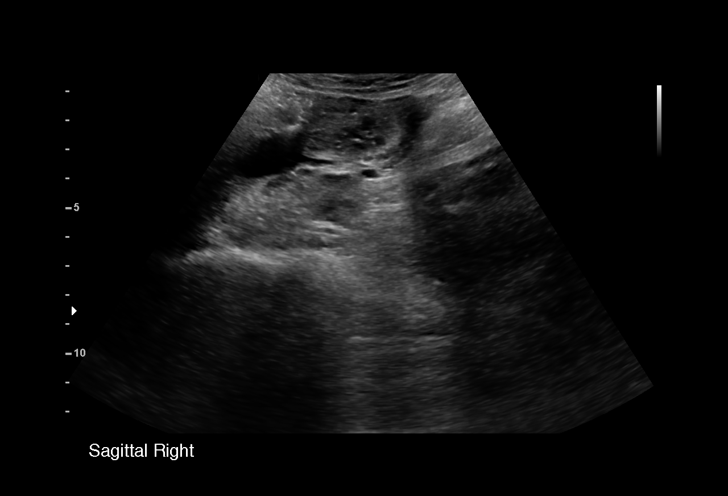
[im 34/89]
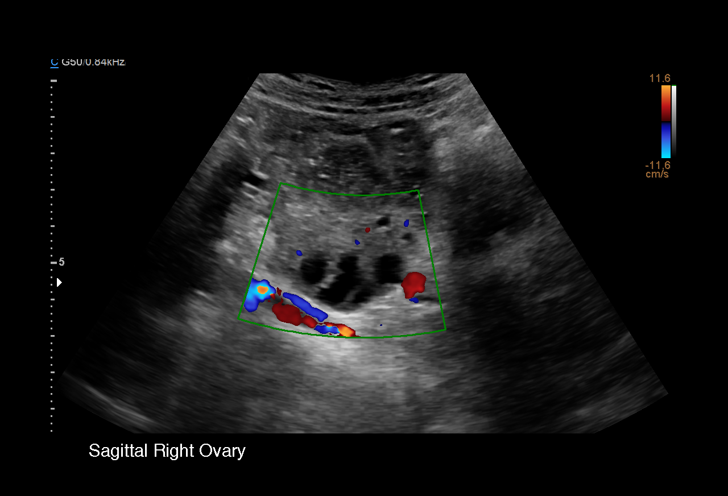
[im 37/89]
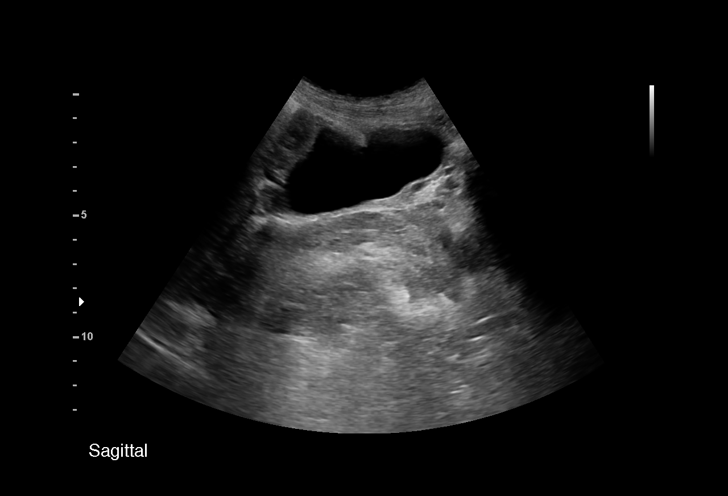
[im 45/89]
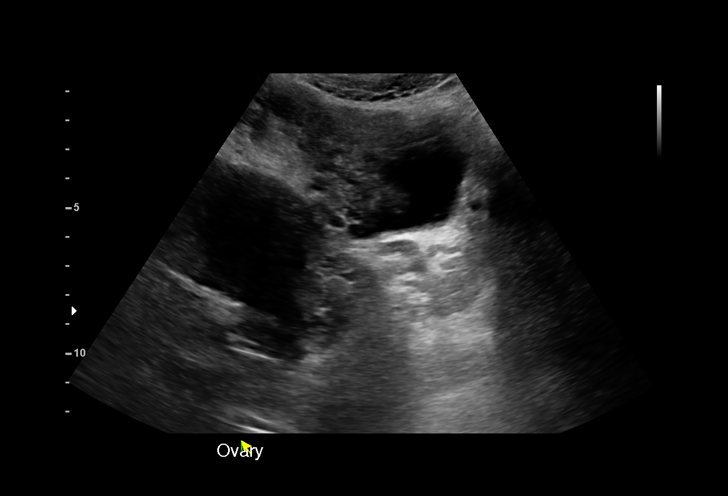
[im 52/89]
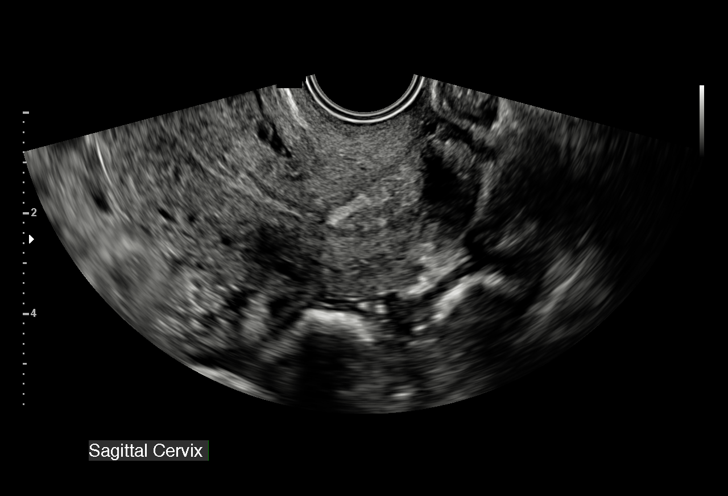
[im 56/89]
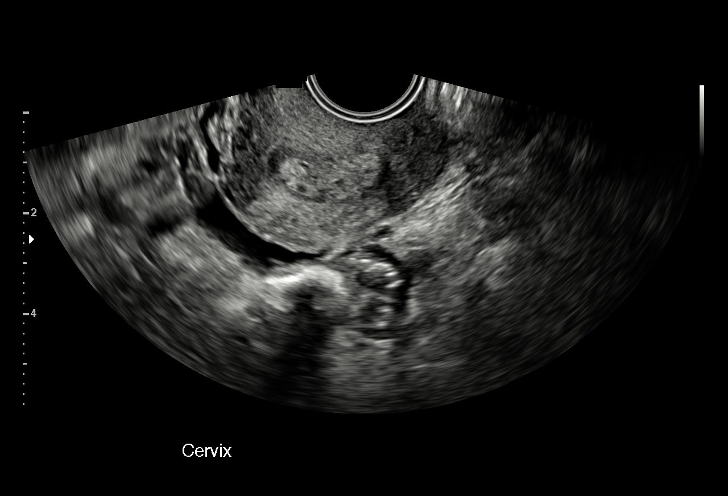
[im 63/89]
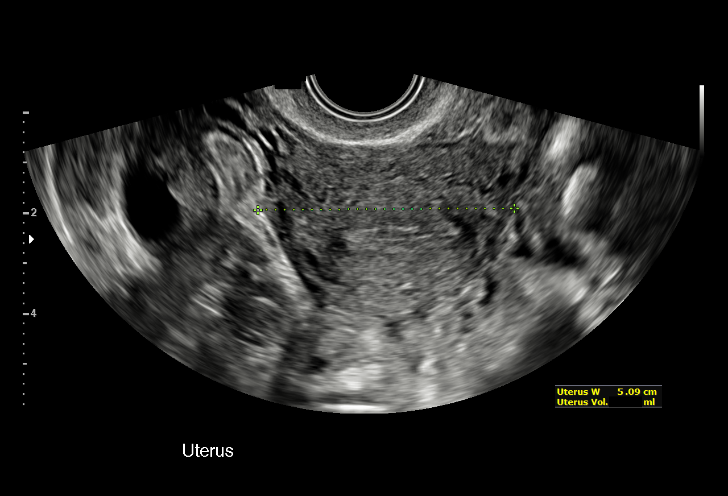
[im 70/89]
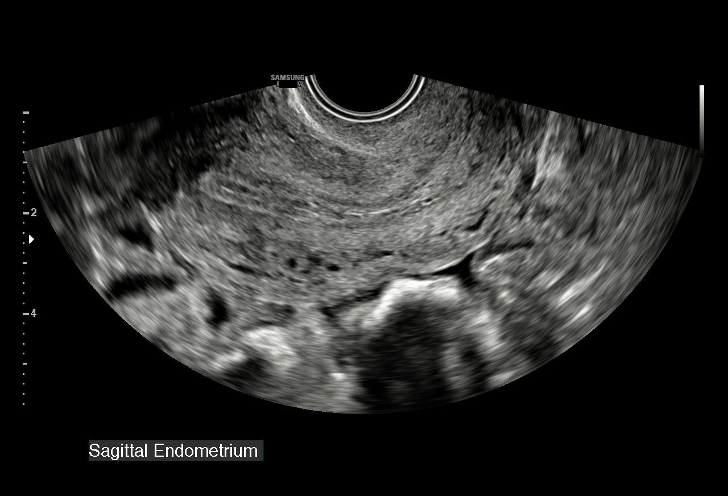
[im 74/89]
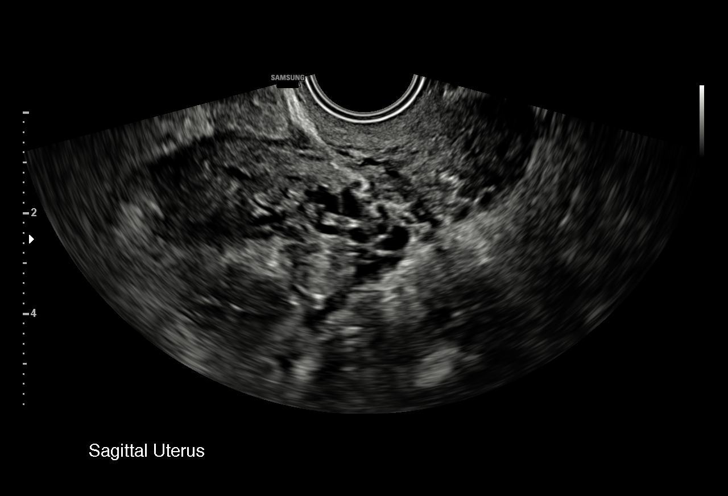
[im 81/89]
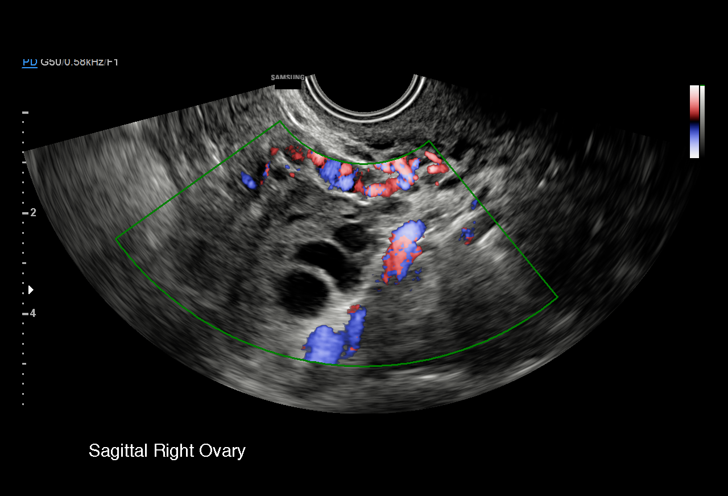
[im 89/89]
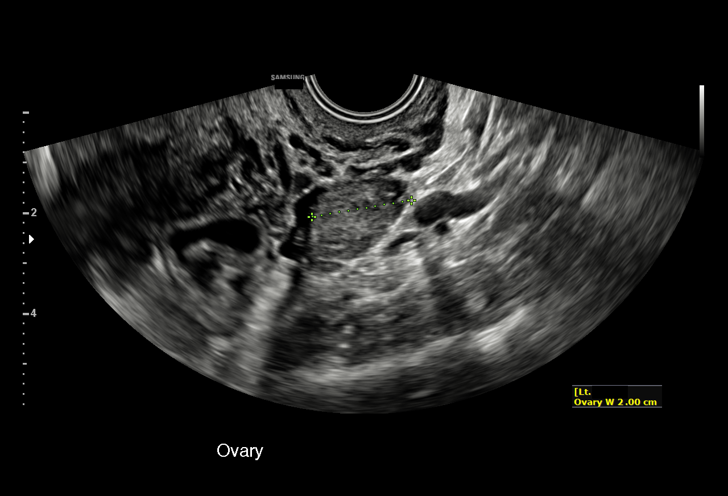

[15 of 25 positions shown; findings below may reference images not displayed]

FINDINGS: Uterus

Measurements: 8.1 x 3.3 x 4.0 cm = volume: 56 mL. No fibroids or
other mass visualized.

Endometrium

Thickness: 6 mm. Tri layered appearance. No focal abnormality
visualized.

Right ovary

Measurements: 2.9 x 1.8 x 2.4 cm = volume: 6.7 mL. Normal
appearance/no adnexal mass.

Left ovary

Measurements: 3.2 x 1.6 x 2.4 cm = volume: 6.4 mL. Normal
appearance/no adnexal mass.

Other findings

No abnormal free fluid.
IMPRESSION: Normal appearance of uterus and both ovaries. No mass or other
significant abnormality identified.

## 2020-12-12 IMAGING — US US PELVIS COMPLETE
1 series · 13 of 25 positions shown · non-contrast
Comparison: 06/30/2018

CLINICAL DATA: Left lower pelvic pain



[Series 1: us pelvis complete · 0.21mm/px · 137 acquisitions, 13 frames shown]
[im 1/137]
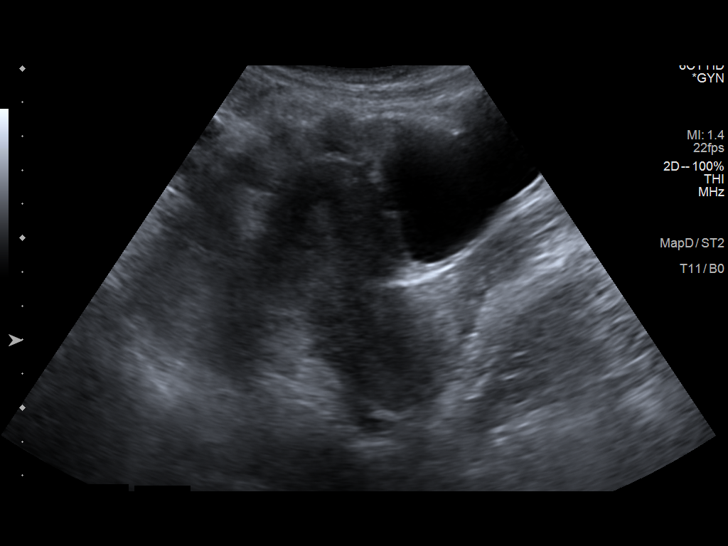
[im 12/137]
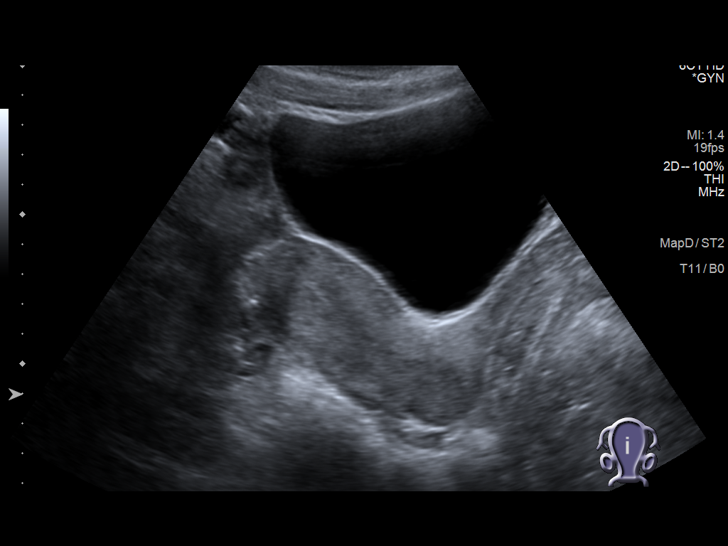
[im 23/137]
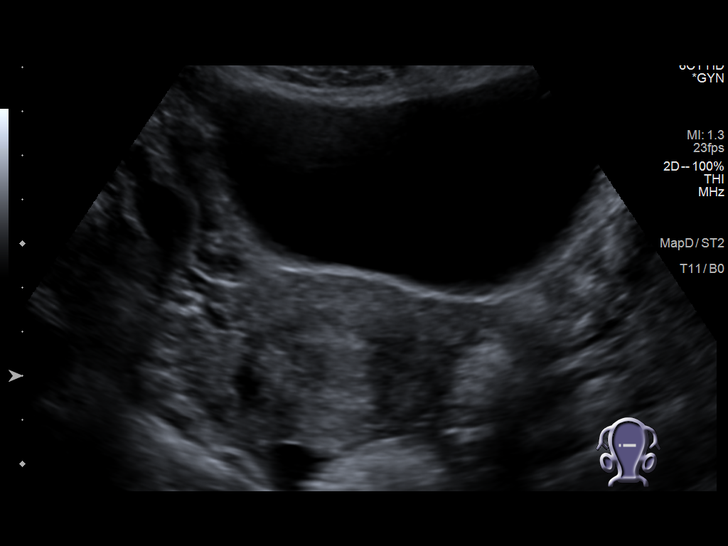
[im 35/137]
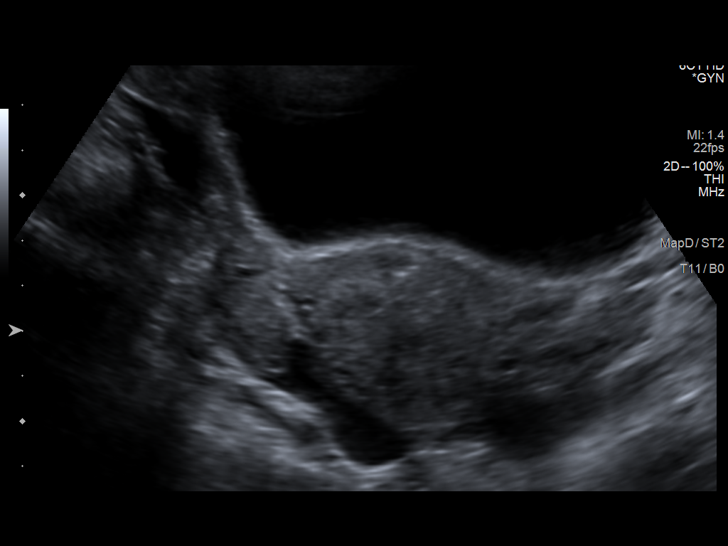
[im 46/137]
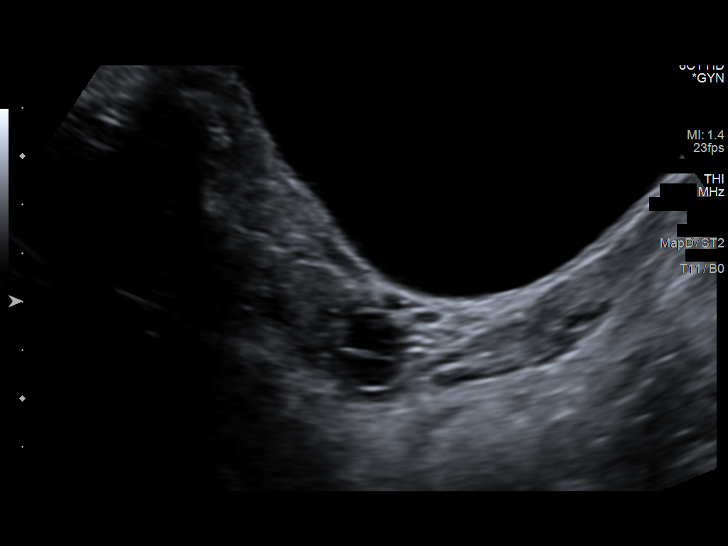
[im 57/137]
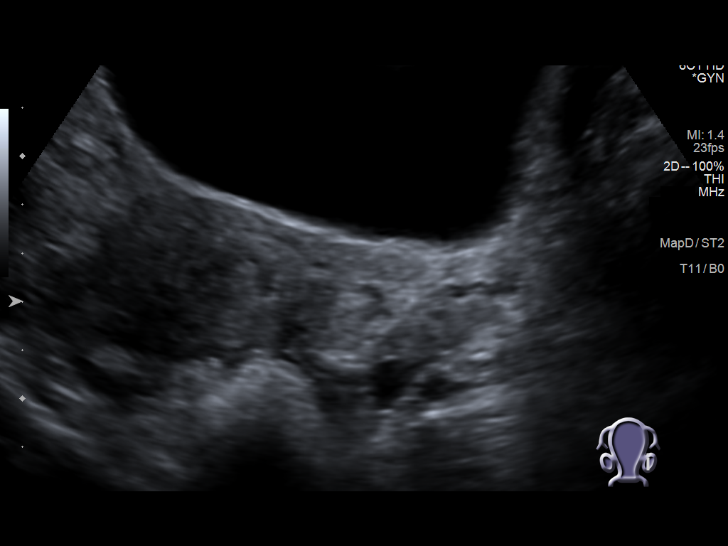
[im 69/137]
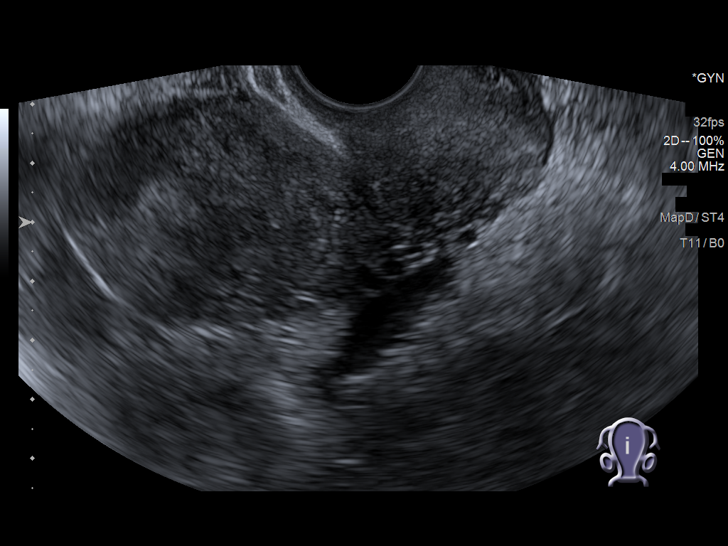
[im 80/137]
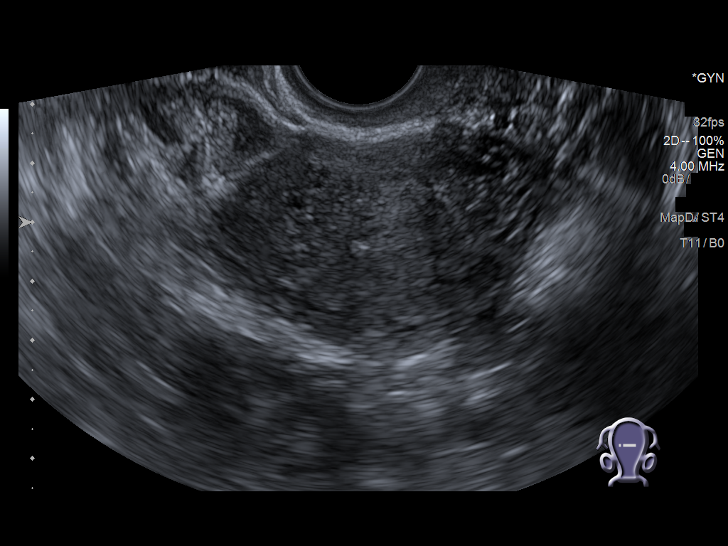
[im 91/137]
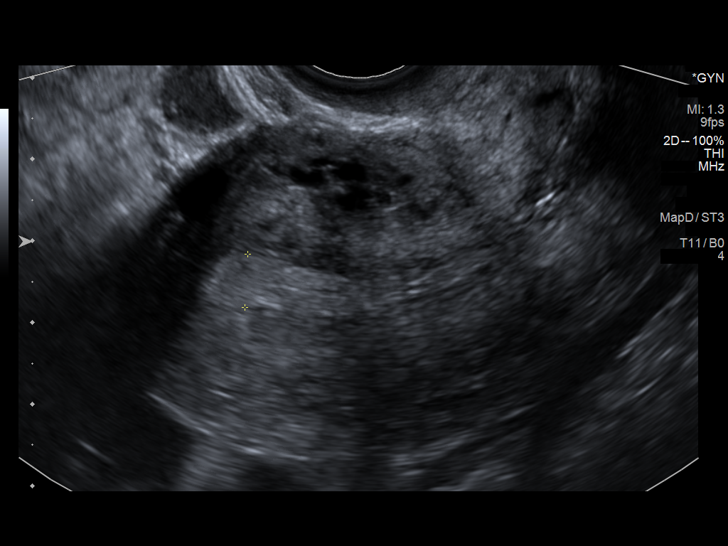
[im 103/137]
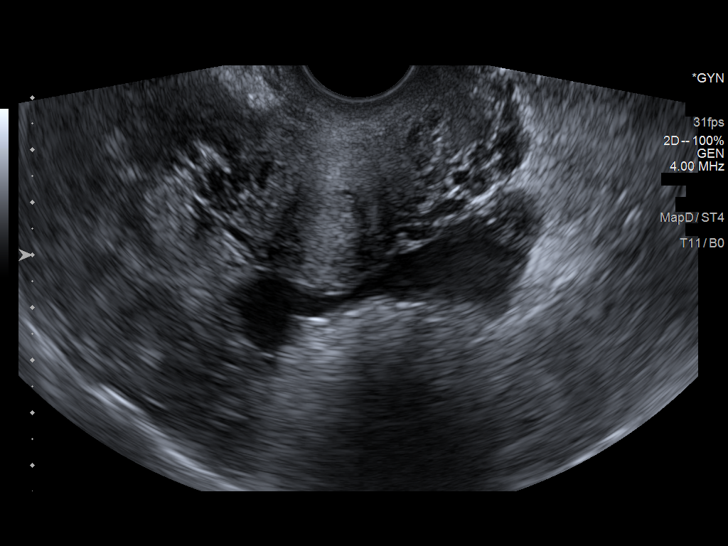
[im 114/137]
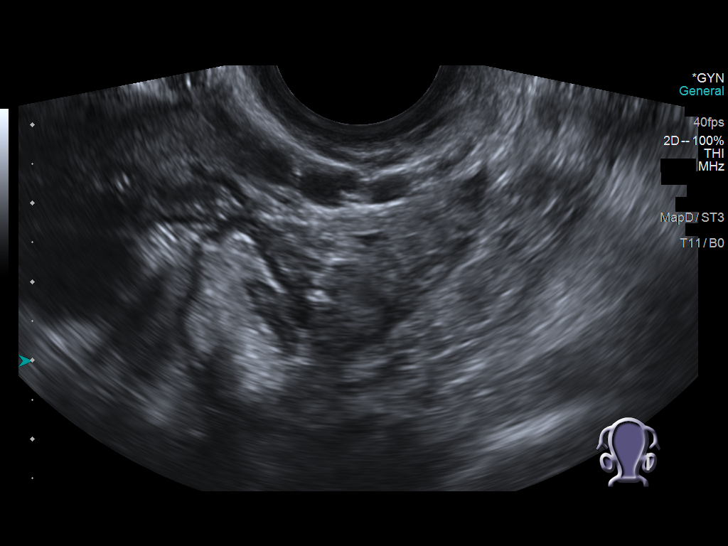
[im 125/137]
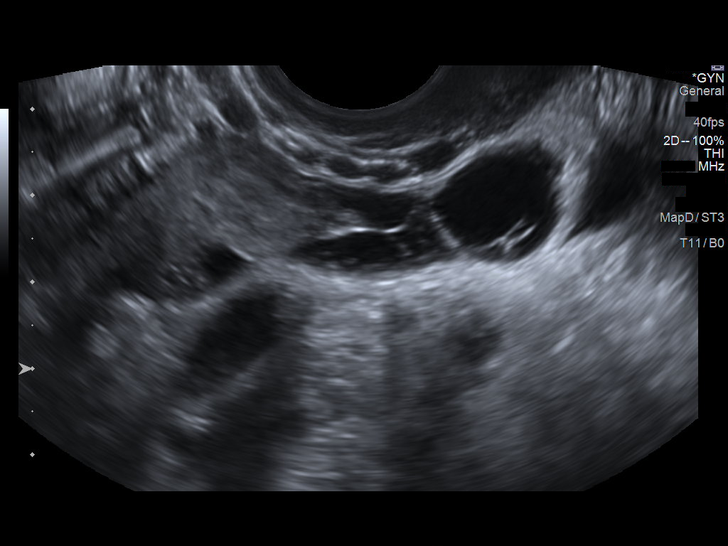
[im 137/137]
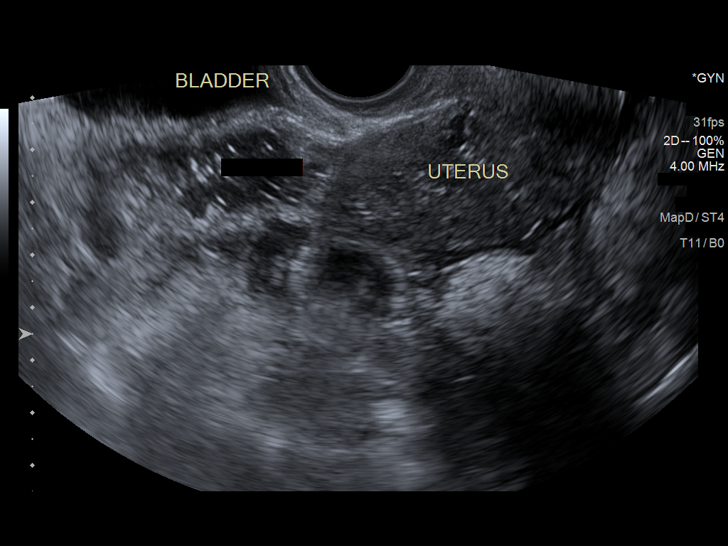

[13 of 25 positions shown; findings below may reference images not displayed]

FINDINGS: Uterus

Measurements: 8.4 x 3.9 x 4.9 cm = volume: 84 mL. No fibroids or
other mass visualized.

Endometrium

Thickness: 6 mm in thickness.  No focal abnormality visualized.

Right ovary

Measurements: 3.2 x 1.8 x 2.7 cm = volume: 8 mL. Normal
appearance/no adnexal mass.

Left ovary

Measurements: 2.6 x 1.4 x 1.8 cm = volume: 3 mL. No ovarian mass.
Probable cluster of left para ovarian cysts measuring up to 1.6 cm
containing thin internal septation.

Other findings

Small amount of free fluid in the pelvis.
IMPRESSION: Clustered left paraovarian cysts adjacent to the left ovary, the
largest measuring up to 1.6 cm with thin internal septation.

Small amount of free fluid in the pelvis.

## 2021-08-13 NOTE — Progress Notes (Signed)
? ?ANNUAL EXAM ?Patient name: Katrina Deleon MRN [id number]  Date of birth: 1976-05-19 ?Chief Complaint:   ?Annual Exam ? ?History of Present Illness:   ?Katrina Deleon is a 46 y.o. G0P0000 female being seen today for a routine annual exam.  ? ?Current complaints: None except intermittent vaginal itching.  ? ?She was diagnosed with BRCA1 and has undergone RRSO with gynonc in 2021. She has been diagnosed with breast cancer that was triple neg and follows with onc for this. She gets CA125 each year for peace of mind. She is also s/p mastectomy.  ? ?No LMP recorded. (Menstrual status: Other). ? ? ?The pregnancy intention screening data noted above was reviewed. Potential methods of contraception were discussed. The patient elected to proceed with No data recorded.  ? ?Last pap 2021. Results were: NILM w/ HRHPV negative. H/O abnormal pap: yes AGUS/HPV neg in 2020.  She had an EMB which showed a polyp. ECC and Bx showed LSIL/CIN1.  ? ?Health Maintenance Due  ?Topic Date Due  ? COVID-19 Vaccine (1) Never done  ? MAMMOGRAM  Never done  ? Hepatitis C Screening  Never done  ? TETANUS/TDAP  Never done  ? INFLUENZA VACCINE  Never done  ? ? ? ? ?  10/16/2017  ?  8:28 AM  ?Depression screen PHQ 2/9  ?Decreased Interest 0  ?Down, Depressed, Hopeless 0  ?PHQ - 2 Score 0  ? ?  ?   ? View : No data to display.  ?  ?  ?  ? ? ? ?Review of Systems:   ?Pertinent items are noted in HPI ?Denies any headaches, blurred vision, fatigue, shortness of breath, chest pain, abdominal pain, abnormal vaginal discharge/itching/odor/irritation, problems with periods, bowel movements, urination, or intercourse unless otherwise stated above.  ?Pertinent History Reviewed:  ?Reviewed past medical,surgical, social and family history.  ?Reviewed problem list, medications and allergies. ?Physical Assessment:  ? ?Vitals:  ? 08/19/21 0918  ?BP: 124/70  ?Pulse: 67  ?Weight: 156 lb (70.8 kg)  ?Height: '5\' 11"'  (1.803 m)  ?Body mass index is 21.76 kg/m?. ?   ?Physical Examination:  ?General appearance - well appearing, and in no distress ?Mental status - alert, oriented to person, place, and time ?Psych:  She has a normal mood and affect ?Skin - warm and dry, normal color, no suspicious lesions noted ?Chest - effort normal, all lung fields clear to auscultation bilaterally ?Heart - normal rate and regular rhythm ?Neck:  midline trachea, no thyromegaly or nodules ?Breasts - breasts appear normal, no suspicious masses, no skin or nipple changes or  axillary nodes ?Abdomen - soft, nontender, nondistended, no masses or organomegaly ?Pelvic -  ?VULVA: normal appearing vulva with no masses, tenderness or lesions   ?VAGINA: normal appearing vagina with normal color and discharge, no lesions, atrophy noted ?CERVIX: normal appearing cervix without discharge or lesions, no CMT ?UTERUS: uterus is felt to be normal size, shape, consistency and nontender  ?ADNEXA: No adnexal masses, ovaries and tubes surgically absent. ?Extremities:  No swelling or varicosities noted ? ?Chaperone present for exam ? ?No results found for this or any previous visit (from the past 24 hour(s)).  ?Assessment & Plan:  ?Katrina Deleon was seen today for annual exam. ? ?Diagnoses and all orders for this visit: ? ?Encounter for annual routine gynecological examination ?- Cervical cancer screening: Discussed guidelines. Pap with HPV done  ?- STD Testing: not indicated ?- Breast Health: Followed closely due to history by exam. S/p mastectomy for BRCA ?-  F/U 12 months and prn  ?-     Cytology - PAP ? ? ?Atypical glandular cells of undetermined significance (AGUS) on cervical Pap smear ?-     Cytology - PAP ? ?BRCA1 Carrier ?- CA125 per pt request/peace of mind ? ? ? ?Orders Placed This Encounter  ?Procedures  ? CA 125  ? ? ?Meds: No orders of the defined types were placed in this encounter. ? ? ?Follow-up: No follow-ups on file. ? ?Radene Gunning, MD ?08/19/2021 ?9:53 AM ?

## 2021-08-19 ENCOUNTER — Other Ambulatory Visit: Payer: Self-pay

## 2021-08-19 ENCOUNTER — Other Ambulatory Visit (HOSPITAL_COMMUNITY)
Admission: RE | Admit: 2021-08-19 | Discharge: 2021-08-19 | Disposition: A | Discharge status: Home / Self Care | Payer: 59 | Source: Ambulatory Visit | Attending: Obstetrics and Gynecology | Admitting: Obstetrics and Gynecology

## 2021-08-19 ENCOUNTER — Ambulatory Visit (INDEPENDENT_AMBULATORY_CARE_PROVIDER_SITE_OTHER): Payer: 59 | Admitting: Obstetrics and Gynecology

## 2021-08-19 ENCOUNTER — Encounter: Payer: Self-pay | Admitting: Obstetrics and Gynecology

## 2021-08-19 VITALS — BP 124/70 | HR 67 | Ht 71.0 in | Wt 156.0 lb

## 2021-08-19 DIAGNOSIS — Z1509 Genetic susceptibility to other malignant neoplasm: Secondary | ICD-10-CM | POA: Diagnosis not present

## 2021-08-19 DIAGNOSIS — R87619 Unspecified abnormal cytological findings in specimens from cervix uteri: Secondary | ICD-10-CM

## 2021-08-19 DIAGNOSIS — Z1501 Genetic susceptibility to malignant neoplasm of breast: Secondary | ICD-10-CM | POA: Diagnosis not present

## 2021-08-19 DIAGNOSIS — Z01419 Encounter for gynecological examination (general) (routine) without abnormal findings: Secondary | ICD-10-CM

## 2021-08-19 NOTE — Patient Instructions (Signed)
Replens is a vaginal moisturizer that can help with vaginal pH.  ?

## 2021-08-20 LAB — CA 125: CA 125: 7 U/mL (ref <35)

## 2021-08-21 LAB — CYTOLOGY - PAP
Comment: NEGATIVE
Diagnosis: NEGATIVE
High risk HPV: NEGATIVE

## 2021-11-11 ENCOUNTER — Ambulatory Visit: Payer: 59 | Admitting: Obstetrics & Gynecology
